# Patient Record
Sex: Female | Born: 1995 | Race: White | Hispanic: No | Marital: Single | Smoking: Former smoker
Health system: Southern US, Community
[De-identification: ages and names within clinical notes are randomized; demographics above are authoritative.]

## PROBLEM LIST (undated history)

## (undated) DIAGNOSIS — Z9152 Personal history of nonsuicidal self-harm: Secondary | ICD-10-CM

## (undated) DIAGNOSIS — F329 Major depressive disorder, single episode, unspecified: Secondary | ICD-10-CM

## (undated) DIAGNOSIS — N920 Excessive and frequent menstruation with regular cycle: Secondary | ICD-10-CM

## (undated) DIAGNOSIS — F32A Depression, unspecified: Secondary | ICD-10-CM

## (undated) DIAGNOSIS — Z8659 Personal history of other mental and behavioral disorders: Secondary | ICD-10-CM

## (undated) HISTORY — DX: Major depressive disorder, single episode, unspecified: F32.9

## (undated) HISTORY — DX: Depression, unspecified: F32.A

## (undated) HISTORY — DX: Personal history of other mental and behavioral disorders: Z86.59

## (undated) HISTORY — DX: Personal history of nonsuicidal self-harm: Z91.52

## (undated) HISTORY — DX: Excessive and frequent menstruation with regular cycle: N92.0

---
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
## 2013-03-11 ENCOUNTER — Encounter: Payer: Self-pay | Admitting: Internal Medicine

## 2013-03-11 ENCOUNTER — Ambulatory Visit (INDEPENDENT_AMBULATORY_CARE_PROVIDER_SITE_OTHER): Payer: Federal, State, Local not specified - PPO | Admitting: Internal Medicine

## 2013-03-11 VITALS — BP 116/70 | HR 81 | Temp 99.0°F | Wt 176.2 lb

## 2013-03-11 DIAGNOSIS — J069 Acute upper respiratory infection, unspecified: Secondary | ICD-10-CM

## 2013-03-11 MED ORDER — AZITHROMYCIN 250 MG PO TABS: ORAL_TABLET | ORAL | Status: DC

## 2013-03-11 NOTE — Progress Notes (Signed)
Pre-visit discussion using our clinic review tool. No additional management support is needed unless otherwise documented below in the visit note.  

## 2013-03-11 NOTE — Progress Notes (Signed)
HPI  Pt presents to the clinic today with c/o cold symptoms x 4 days. She has a productive cough of thick yellow sputum, nasal congestion and ear fullness. She has had fevers, but denies chill and body aches. She has taken Mucinex, Dayquil, Nyquil, Delsym and allergy medication without any relief. She has no history of allergies or asthma. She has had sick contacts. She did not get the flu shot.  Review of Systems      Past Medical History  Diagnosis Date  . History of self mutilation     h/o admissions to behavioral health  . Depression   . Menorrhagia     Family History  Problem Relation Age of Onset  . Heart disease Father 8446    MI  . Cancer Maternal Grandmother 40    breast ca    History   Social History  . Marital Status: Single    Spouse Name: N/A    Number of Children: N/A  . Years of Education: N/A   Occupational History  . Not on file.   Social History Main Topics  . Smoking status: Current Every Day Smoker  . Smokeless tobacco: Not on file  . Alcohol Use: No  . Drug Use: Not on file  . Sexual Activity: Not on file   Other Topics Concern  . Not on file   Social History Narrative   Dropped out of school.   Going to get her GED, wants to go to community college.   Not sexually active.   Good relationship with her mother.    No Known Allergies   Constitutional: Positive headache, fatigue and fever. Denies abrupt weight changes.  HEENT:  Positive sore throat. Denies eye redness, eye pain, pressure behind the eyes, facial pain, nasal congestion, ear pain, ringing in the ears, wax buildup, runny nose or bloody nose. Respiratory: Positive cough. Denies difficulty breathing or shortness of breath.  Cardiovascular: Denies chest pain, chest tightness, palpitations or swelling in the hands or feet.   No other specific complaints in a complete review of systems (except as listed in HPI above).  Objective:   BP 116/70  Pulse 81  Temp(Src) 99 F (37.2 C)  (Oral)  Wt 176 lb 4 oz (79.946 kg)  SpO2 99%  LMP 02/08/2013 Wt Readings from Last 3 Encounters:  03/11/13 176 lb 4 oz (79.946 kg) (94%*, Z = 1.54)  10/17/12 183 lb 8 oz (83.235 kg) (96%*, Z = 1.70)  11/20/11 166 lb (75.297 kg) (92%*, Z = 1.41)   * Growth percentiles are based on CDC 2-20 Years data.     General: Appears her stated age, well developed, well nourished in NAD. HEENT: Head: normal shape and size; Eyes: sclera white, no icterus, conjunctiva pink, PERRLA and EOMs intact; Ears: Tm's gray and intact, normal light reflex, + effusion bilaterally; Nose: mucosa pink and moist, septum midline; Throat/Mouth: + PND. Teeth present, tonsils enlarged,  mucosa erythematous and moist, no exudate noted, no lesions or ulcerations noted.  Neck: Mild cervical lymphadenopathy. Neck supple, trachea midline. No massses, lumps or thyromegaly present.  Cardiovascular: Normal rate and rhythm. S1,S2 noted.  No murmur, rubs or gallops noted. No JVD or BLE edema. No carotid bruits noted. Pulmonary/Chest: Normal effort and positive vesicular breath sounds. No respiratory distress. No wheezes, rales or ronchi noted.      Assessment & Plan:   Upper Respiratory Infection:  Get some rest and drink plenty of water Do salt water gargles for the sore  throat Given fever and duration of symptoms, will give eRx for Azithromax x 5 days Continue tylenol  RTC as needed or if symptoms persist.

## 2013-03-11 NOTE — Patient Instructions (Signed)

## 2013-03-12 ENCOUNTER — Telehealth: Payer: Self-pay | Admitting: Family Medicine

## 2013-03-12 NOTE — Telephone Encounter (Signed)
Relevant patient education assigned to patient using Emmi. ° °

## 2013-08-20 ENCOUNTER — Encounter: Payer: Self-pay | Admitting: Family Medicine

## 2013-08-20 ENCOUNTER — Ambulatory Visit (INDEPENDENT_AMBULATORY_CARE_PROVIDER_SITE_OTHER): Payer: Federal, State, Local not specified - PPO | Admitting: Family Medicine

## 2013-08-20 VITALS — BP 118/72 | HR 80 | Temp 98.4°F | Wt 168.2 lb

## 2013-08-20 DIAGNOSIS — J209 Acute bronchitis, unspecified: Secondary | ICD-10-CM

## 2013-08-20 MED ORDER — AZITHROMYCIN 250 MG PO TABS: ORAL_TABLET | ORAL | Status: DC

## 2013-08-20 MED ORDER — GUAIFENESIN-CODEINE 100-10 MG/5ML PO SYRP: 5.0000 mL | ORAL_SOLUTION | Freq: Every evening | ORAL | Status: DC | PRN

## 2013-08-20 NOTE — Progress Notes (Signed)
Pre visit review using our clinic review tool, if applicable. No additional management support is needed unless otherwise documented below in the visit note. 

## 2013-08-20 NOTE — Assessment & Plan Note (Signed)
Discussed supportive care in form of ibuprofen, rest and fluids. Will treat with cheratussin for night time cough suppression. Provided with zpack script to fill if no improvement in next few days, or sooner if worsening. Pt agrees with plan.

## 2013-08-20 NOTE — Progress Notes (Signed)
   BP 118/72  Pulse 80  Temp(Src) 98.4 F (36.9 C) (Oral)  Wt 168 lb 4 oz (76.318 kg)  SpO2 97%  LMP 07/25/2013   CC: cough  Subjective:    Patient ID: Nancy Rojas, female    DOB: 1996-02-02, 18 y.o.   MRN: [id number]  HPI: Nancy Rojas is a 18 y.o. female presenting on 08/20/2013 for Cough   9 d h/o ST that developed into RN, congestion, muffled hearing with ear pain, cough productive of green sputum, PNdrainage that led to nausea/vomiting.  Coughing fits that lead to gagging.  Cough waking her up at night as well.  No fevers/chills, abd pain,   So far has tried mucinex.  Smoking 2-3 cig/day. No sick contacts at home No asthma hx.  Relevant past medical, surgical, family and social history reviewed and updated as indicated.  Allergies and medications reviewed and updated. Current Outpatient Prescriptions on File Prior to Visit  Medication Sig  . etonogestrel-ethinyl estradiol (NUVARING) 0.12-0.015 MG/24HR vaginal ring Insert vaginally and leave in place for 3 consecutive weeks, then remove for 1 week.   No current facility-administered medications on file prior to visit.    Review of Systems Per HPI unless specifically indicated above    Objective:    BP 118/72  Pulse 80  Temp(Src) 98.4 F (36.9 C) (Oral)  Wt 168 lb 4 oz (76.318 kg)  SpO2 97%  LMP 07/25/2013  Physical Exam  Nursing note and vitals reviewed. Constitutional: She appears well-developed and well-nourished. No distress.  HENT:  Head: Normocephalic and atraumatic.  Right Ear: Hearing, tympanic membrane, external ear and ear canal normal.  Left Ear: Hearing, tympanic membrane, external ear and ear canal normal.  Nose: No mucosal edema or rhinorrhea. Right sinus exhibits no maxillary sinus tenderness and no frontal sinus tenderness. Left sinus exhibits no maxillary sinus tenderness and no frontal sinus tenderness.  Mouth/Throat: Uvula is midline, oropharynx is clear and moist and mucous  membranes are normal. No oropharyngeal exudate, posterior oropharyngeal edema, posterior oropharyngeal erythema or tonsillar abscesses.  Eyes: Conjunctivae and EOM are normal. Pupils are equal, round, and reactive to light. No scleral icterus.  Neck: Normal range of motion. Neck supple.  Cardiovascular: Normal rate, regular rhythm, normal heart sounds and intact distal pulses.   No murmur heard. Pulmonary/Chest: Effort normal and breath sounds normal. No respiratory distress. She has no wheezes. She has no rales.  Deep harsh cough present  Lymphadenopathy:    She has no cervical adenopathy.  Skin: Skin is warm and dry. No rash noted.       Assessment & Plan:   Problem List Items Addressed This Visit   Acute bronchitis - Primary     Discussed supportive care in form of ibuprofen, rest and fluids. Will treat with cheratussin for night time cough suppression. Provided with zpack script to fill if no improvement in next few days, or sooner if worsening. Pt agrees with plan.        Follow up plan: Return if symptoms worsen or fail to improve.

## 2013-08-20 NOTE — Patient Instructions (Signed)
I think you have developed bronchitis Treat with lots of rest and lots of fluids. May use ibuprofen 400mg  twice daily with food for inflammation in lungs. May try codeine cough syrup for night time (may make you sleepy). If not improving over next 1-2 days or any worsening, fill antibiotic (zpack) prescribed today. Watch for fevers, worsening productive cough, or just not improving as expected - and let us know if that happens.

## 2013-08-28 ENCOUNTER — Ambulatory Visit: Payer: Federal, State, Local not specified - PPO | Admitting: Internal Medicine

## 2013-08-28 DIAGNOSIS — Z0289 Encounter for other administrative examinations: Secondary | ICD-10-CM

## 2013-08-31 ENCOUNTER — Ambulatory Visit: Payer: Federal, State, Local not specified - PPO | Admitting: Internal Medicine

## 2013-08-31 DIAGNOSIS — Z0289 Encounter for other administrative examinations: Secondary | ICD-10-CM

## 2013-09-10 ENCOUNTER — Ambulatory Visit: Payer: Federal, State, Local not specified - PPO | Admitting: Family Medicine

## 2013-11-04 ENCOUNTER — Encounter: Payer: Self-pay | Admitting: Family Medicine

## 2013-11-04 ENCOUNTER — Ambulatory Visit (INDEPENDENT_AMBULATORY_CARE_PROVIDER_SITE_OTHER): Payer: Federal, State, Local not specified - PPO | Admitting: Family Medicine

## 2013-11-04 VITALS — BP 116/66 | HR 77 | Temp 98.3°F | Ht 67.25 in | Wt 164.0 lb

## 2013-11-04 DIAGNOSIS — N921 Excessive and frequent menstruation with irregular cycle: Secondary | ICD-10-CM

## 2013-11-04 DIAGNOSIS — N920 Excessive and frequent menstruation with regular cycle: Secondary | ICD-10-CM

## 2013-11-04 DIAGNOSIS — Z3009 Encounter for other general counseling and advice on contraception: Secondary | ICD-10-CM

## 2013-11-04 DIAGNOSIS — Z309 Encounter for contraceptive management, unspecified: Secondary | ICD-10-CM | POA: Insufficient documentation

## 2013-11-04 DIAGNOSIS — F988 Other specified behavioral and emotional disorders with onset usually occurring in childhood and adolescence: Secondary | ICD-10-CM | POA: Insufficient documentation

## 2013-11-04 MED ORDER — ETONOGESTREL-ETHINYL ESTRADIOL 0.12-0.015 MG/24HR VA RING: VAGINAL_RING | VAGINAL | Status: DC

## 2013-11-04 NOTE — Assessment & Plan Note (Signed)
Improved. >25 minutes spent in face to face time with patient, >50% spent in counselling or coordination of care Nuvaring refilled.

## 2013-11-04 NOTE — Assessment & Plan Note (Signed)
Explained to pt that she would need formal psych eval. Referral placed.

## 2013-11-04 NOTE — Progress Notes (Signed)
   Subjective:   Patient ID: Nancy Rojas, female    DOB: Mar 24, 1995, 18 y.o.   MRN: [id number]  [name] is a pleasant 18 y.o. year old female who presents to clinic today with Follow-up  on 11/04/2013  HPI: Menorrhagia- no longer sexually active.  Likes nuvaring- periods are now regular and light.  Minimal cramping.  ?ADD- per pt, was diagnosed with ADD years ago. Has h/o depression and self mutilation. Wants me to prescribe stimulant for her.  No current outpatient prescriptions on file prior to visit.   No current facility-administered medications on file prior to visit.    No Known Allergies  Past Medical History  Diagnosis Date  . History of self mutilation     h/o admissions to behavioral health  . Depression   . Menorrhagia     No past surgical history on file.  Family History  Problem Relation Age of Onset  . Heart disease Father 49    MI  . Cancer Maternal Grandmother 40    breast ca    History   Social History  . Marital Status: Single    Spouse Name: N/A    Number of Children: N/A  . Years of Education: N/A   Occupational History  . Not on file.   Social History Main Topics  . Smoking status: Current Every Day Smoker  . Smokeless tobacco: Not on file  . Alcohol Use: No  . Drug Use: Not on file  . Sexual Activity: Not on file   Other Topics Concern  . Not on file   Social History Narrative   Dropped out of school.   Going to get her GED, wants to go to community college.   Not sexually active.   Good relationship with her mother.   The PMH, PSH, Social History, Family History, Medications, and allergies have been reviewed in [facility], and have been updated if relevant.   Review of Systems See HPI Denies any current anxiety or depression +difficulty concentrating at work No SI or HI    Objective:    BP 116/66  Pulse 77  Temp(Src) 98.3 F (36.8 C) (Oral)  Ht 5' 7.25" (1.708 m)  Wt 164 lb (74.39 kg)  BMI 25.50 kg/m2   SpO2 99%  LMP 10/29/2013   Physical Exam  Nursing note and vitals reviewed. Constitutional: She appears well-developed and well-nourished. No distress.  Skin: Skin is warm and dry.  Psychiatric: She has a normal mood and affect. Her behavior is normal. Judgment and thought content normal.          Assessment & Plan:   Encounter for other general counseling or advice on contraception  ADD (attention deficit disorder) - Plan: Ambulatory referral to Psychology  Menorrhagia with irregular cycle No Follow-up on file.

## 2013-11-04 NOTE — Progress Notes (Signed)
Pre visit review using our clinic review tool, if applicable. No additional management support is needed unless otherwise documented below in the visit note. 

## 2013-11-27 ENCOUNTER — Ambulatory Visit (INDEPENDENT_AMBULATORY_CARE_PROVIDER_SITE_OTHER): Payer: Federal, State, Local not specified - PPO | Admitting: Psychology

## 2013-11-27 DIAGNOSIS — F331 Major depressive disorder, recurrent, moderate: Secondary | ICD-10-CM

## 2013-11-27 DIAGNOSIS — F988 Other specified behavioral and emotional disorders with onset usually occurring in childhood and adolescence: Secondary | ICD-10-CM

## 2013-11-30 ENCOUNTER — Ambulatory Visit: Payer: Federal, State, Local not specified - PPO | Admitting: Family Medicine

## 2013-11-30 DIAGNOSIS — Z0289 Encounter for other administrative examinations: Secondary | ICD-10-CM

## 2013-12-09 ENCOUNTER — Ambulatory Visit (INDEPENDENT_AMBULATORY_CARE_PROVIDER_SITE_OTHER): Payer: Federal, State, Local not specified - PPO | Admitting: Psychology

## 2013-12-09 DIAGNOSIS — F900 Attention-deficit hyperactivity disorder, predominantly inattentive type: Secondary | ICD-10-CM

## 2013-12-09 DIAGNOSIS — F411 Generalized anxiety disorder: Secondary | ICD-10-CM

## 2013-12-18 ENCOUNTER — Ambulatory Visit: Payer: Federal, State, Local not specified - PPO | Admitting: Psychology

## 2014-07-16 ENCOUNTER — Ambulatory Visit (INDEPENDENT_AMBULATORY_CARE_PROVIDER_SITE_OTHER): Payer: Federal, State, Local not specified - PPO | Admitting: Emergency Medicine

## 2014-07-16 VITALS — BP 122/68 | HR 82 | Temp 98.0°F | Resp 17 | Ht 68.0 in | Wt 173.0 lb

## 2014-07-16 DIAGNOSIS — J020 Streptococcal pharyngitis: Secondary | ICD-10-CM

## 2014-07-16 MED ORDER — PENICILLIN V POTASSIUM 500 MG PO TABS: 500.0000 mg | ORAL_TABLET | Freq: Four times a day (QID) | ORAL | Status: DC

## 2014-07-16 NOTE — Patient Instructions (Signed)

## 2014-07-16 NOTE — Progress Notes (Signed)
[facility] [address], [place] [id number] [phone]  Date:  07/16/2014   Name:  Nancy MountValerie R Copado   DOB:  05-08-1995   MRN:  [id number]  PCP:  Ruthe Mannanalia Aron, MD    Chief Complaint: Sore Throat   History of Present Illness:  Nancy Rojas is a 19 y.o. very pleasant female patient who presents with the following:  Ill three days with increasing sore throat No coryza or post nasal drip No cough, wheezing or shortness of breath Some malaise and fatigue No nausea or vomiting No rash No ill ocntacts No improvement with over the counter medications or other home remedies. . Denies other complaint or health concern today.   Patient Active Problem List   Diagnosis Date Noted  . Contraception management 11/04/2013  . ADD (attention deficit disorder) 11/04/2013  . Acute bronchitis 08/20/2013  . Routine general medical examination at a health care facility 10/17/2010  . History of self mutilation   . Depression   . Menorrhagia     Past Medical History  Diagnosis Date  . History of self mutilation     h/o admissions to behavioral health  . Depression   . Menorrhagia     No past surgical history on file.  History  Substance Use Topics  . Smoking status: Former Smoker    Quit date: 06/17/2014  . Smokeless tobacco: Not on file  . Alcohol Use: No    Family History  Problem Relation Age of Onset  . Heart disease Father 7746    MI  . Cancer Maternal Grandmother 40    breast ca    No Known Allergies  Medication list has been reviewed and updated.  Current Outpatient Prescriptions on File Prior to Visit  Medication Sig Dispense Refill  . etonogestrel-ethinyl estradiol (NUVARING) 0.12-0.015 MG/24HR vaginal ring Insert vaginally and leave in place for 3 consecutive weeks, then remove for 1 week. 1 each 11   No current facility-administered medications on file prior to visit.    Review of Systems:  Review of Systems  Constitutional:  Negative for fever, chills and fatigue.  HENT: Negative for congestion, ear pain, hearing loss, postnasal drip, rhinorrhea and sinus pressure.   Eyes: Negative for discharge and redness.  Respiratory: Negative for cough, shortness of breath and wheezing.   Cardiovascular: Negative for chest pain and leg swelling.  Gastrointestinal: Negative for nausea, vomiting, abdominal pain, constipation and blood in stool.  Genitourinary: Negative for dysuria, urgency and frequency.  Musculoskeletal: Negative for neck stiffness.  Skin: Negative for rash.  Neurological: Negative for seizures, weakness and headaches.   Physical Examination: Filed Vitals:   07/16/14 0918  BP: 122/68  Pulse: 82  Temp: 98 F (36.7 C)  Resp: 17   Filed Vitals:   07/16/14 0918  Height: 5\' 8"  (1.727 m)  Weight: 173 lb (78.472 kg)   Body mass index is 26.31 kg/(m^2). Ideal Body Weight: Weight in (lb) to have BMI = 25: 164.1  GEN: WDWN, NAD, Non-toxic, A & O x 3 HEENT: Atraumatic, Normocephalic. Neck supple. No masses, anterior cervical LAD.  Exudative pharyngitis Ears and Nose: No external deformity. CV: RRR, No M/G/R. No JVD. No thrill. No extra heart sounds. PULM: CTA B, no wheezes, crackles, rhonchi. No retractions. No resp. distress. No accessory muscle use. ABD: S, NT, ND, +BS. No rebound. No HSM. EXTR: No c/c/e NEURO Normal gait.  PSYCH: Normally interactive. Conversant. Not depressed or anxious appearing.  Calm demeanor.  Assessment and Plan: 1. Streptococcal sore throat Pen vk    Signed Phillips OdorJeffery Sawyer Kahan, MD

## 2014-10-11 ENCOUNTER — Ambulatory Visit (INDEPENDENT_AMBULATORY_CARE_PROVIDER_SITE_OTHER): Payer: Federal, State, Local not specified - PPO | Admitting: Family Medicine

## 2014-10-11 VITALS — BP 110/74 | HR 77 | Temp 98.4°F | Resp 18 | Ht 68.0 in | Wt 179.2 lb

## 2014-10-11 DIAGNOSIS — B001 Herpesviral vesicular dermatitis: Secondary | ICD-10-CM

## 2014-10-11 MED ORDER — VALACYCLOVIR HCL 1 G PO TABS: ORAL_TABLET | ORAL | Status: DC

## 2014-10-11 NOTE — Progress Notes (Signed)
[facility] [address] [place] [address] [phone]  Date:  10/11/2014   Name:  Nancy Rojas   DOB:  1996-01-06   MRN:  [id number]  PCP:  Ruthe Mannan, MD    Chief Complaint: Mouth Lesions   History of Present Illness:  Nancy Rojas is a 19 y.o. very pleasant female patient who presents with the following:  She has a histroy of cold sores- recent strep throat as well.  Her throat is now better.   She tried valtrex in the past but does not have any now.   She may get a cold sore every couple of months .    Patient Active Problem List   Diagnosis Date Noted  . Contraception management 11/04/2013  . ADD (attention deficit disorder) 11/04/2013  . Acute bronchitis 08/20/2013  . Routine general medical examination at a health care facility 10/17/2010  . History of self mutilation   . Depression   . Menorrhagia     Past Medical History  Diagnosis Date  . History of self mutilation     h/o admissions to behavioral health  . Depression   . Menorrhagia     History reviewed. No pertinent past surgical history.  History  Substance Use Topics  . Smoking status: Former Smoker    Quit date: 06/17/2014  . Smokeless tobacco: Not on file  . Alcohol Use: No    Family History  Problem Relation Age of Onset  . Heart disease Father 60    MI  . Cancer Maternal Grandmother 40    breast ca    No Known Allergies  Medication list has been reviewed and updated.  Current Outpatient Prescriptions on File Prior to Visit  Medication Sig Dispense Refill  . etonogestrel-ethinyl estradiol (NUVARING) 0.12-0.015 MG/24HR vaginal ring Insert vaginally and leave in place for 3 consecutive weeks, then remove for 1 week. (Patient not taking: Reported on 10/11/2014) 1 each 11  . penicillin v potassium (VEETID) 500 MG tablet Take 1 tablet (500 mg total) by mouth 4 (four) times daily. (Patient not taking: Reported on 10/11/2014) 40 tablet 0   No current  facility-administered medications on file prior to visit.    Review of Systems:  As per HPI- otherwise negative.   Physical Examination: Filed Vitals:   10/11/14 1317  BP: 110/74  Pulse: 77  Temp: 98.4 F (36.9 C)  Resp: 18   Filed Vitals:   10/11/14 1317  Height:  (1.727 m)  Weight: 179 lb 3.2 oz (81.285 kg)   Body mass index is 27.25 kg/(m^2). Ideal Body Weight: Weight in (lb) to have BMI = 25: 164.1  GEN: WDWN, NAD, Non-toxic, A & O x 3, looks well HEENT: Atraumatic, Normocephalic. Neck supple. No masses, No LAD.  Bilateral TM wnl, oropharynx normal.  PEERL,EOMI.   One healing and one newer cold sore on bottom lip Ears and Nose: No external deformity. CV: RRR, No M/G/R. No JVD. No thrill. No extra heart sounds. PULM: CTA B, no wheezes, crackles, rhonchi. No retractions. No resp. distress. No accessory muscle use. EXTR: No c/c/e NEURO Normal gait.  PSYCH: Normally interactive. Conversant. Not depressed or anxious appearing.  Calm demeanor.    Assessment and Plan: Cold sore - Plan: valACYclovir (VALTREX) 1000 MG tablet  Treat with valtrex as needed for cold sores   Signed Abbe Amsterdam, MD

## 2015-01-10 ENCOUNTER — Other Ambulatory Visit: Payer: Self-pay

## 2015-01-10 DIAGNOSIS — B001 Herpesviral vesicular dermatitis: Secondary | ICD-10-CM

## 2015-01-10 NOTE — Telephone Encounter (Signed)
pts mom DPR signed request refill valtrex to [facility]. rx prescribed by Dr Shanda BumpsJessica Copland on 10/11/14; pt last saw Dr Dayton MartesAron on 11/04/13. pts mom wants to know if Dr Dayton MartesAron will refill med? pts mom request cb on 01/11/15. No future appt scheduled.

## 2015-01-11 MED ORDER — VALACYCLOVIR HCL 1 G PO TABS: ORAL_TABLET | ORAL | Status: DC

## 2015-05-15 ENCOUNTER — Emergency Department (HOSPITAL_COMMUNITY)
Admission: EM | Admit: 2015-05-15 | Discharge: 2015-05-15 | Disposition: A | Discharge status: Home / Self Care | Payer: Federal, State, Local not specified - PPO | Attending: Emergency Medicine | Admitting: Emergency Medicine

## 2015-05-15 ENCOUNTER — Encounter (HOSPITAL_COMMUNITY): Payer: Self-pay | Admitting: Emergency Medicine

## 2015-05-15 DIAGNOSIS — Z8742 Personal history of other diseases of the female genital tract: Secondary | ICD-10-CM | POA: Diagnosis not present

## 2015-05-15 DIAGNOSIS — Z8659 Personal history of other mental and behavioral disorders: Secondary | ICD-10-CM | POA: Diagnosis not present

## 2015-05-15 DIAGNOSIS — J029 Acute pharyngitis, unspecified: Secondary | ICD-10-CM | POA: Diagnosis present

## 2015-05-15 DIAGNOSIS — B349 Viral infection, unspecified: Secondary | ICD-10-CM | POA: Insufficient documentation

## 2015-05-15 DIAGNOSIS — R109 Unspecified abdominal pain: Secondary | ICD-10-CM | POA: Insufficient documentation

## 2015-05-15 DIAGNOSIS — F17210 Nicotine dependence, cigarettes, uncomplicated: Secondary | ICD-10-CM | POA: Insufficient documentation

## 2015-05-15 LAB — RAPID STREP SCREEN (MED CTR MEBANE ONLY): STREPTOCOCCUS, GROUP A SCREEN (DIRECT): NEGATIVE

## 2015-05-15 MED ORDER — ONDANSETRON 4 MG PO TBDP: 4.0000 mg | ORAL_TABLET | Freq: Three times a day (TID) | ORAL | Status: DC | PRN

## 2015-05-15 MED ORDER — ONDANSETRON 4 MG PO TBDP
4.0000 mg | ORAL_TABLET | Freq: Once | ORAL | Status: AC
  Administered 2015-05-15: 4 mg via ORAL
  Filled 2015-05-15: qty 1

## 2015-05-15 MED ORDER — IBUPROFEN 800 MG PO TABS
800.0000 mg | ORAL_TABLET | Freq: Once | ORAL | Status: AC
  Administered 2015-05-15: 800 mg via ORAL
  Filled 2015-05-15: qty 1

## 2015-05-15 NOTE — ED Provider Notes (Signed)
CSN: [id number]     Arrival date & time 05/15/15  2114 History  By signing my name below, I, Doreatha Martinva Mathews, attest that this documentation has been prepared under the direction and in the presence of Krishon Adkison K. Vale Peraza, PA-C. Electronically Signed: Doreatha MartinEva Mathews, ED Scribe. 05/15/2015. 10:21 PM.    Chief Complaint  Patient presents with  . Sore Throat   The history is provided by the patient. No language interpreter was used.   HPI Comments: Nancy Rojas is a 20 y.o. female who presents to the Emergency Department complaining of moderate sore throat onset 4 days ago with associated generalized myalgias, diarrhea, emesis, fever, abdominal pain, cough, nausea. Pt states that her sore throat is worsened with swallowing, eating and drinking. She reports that she is able to tolerate fluids. No known sick contacts with similar symptoms. Pt has not had a flu shot this year. No daily medications. NKDA. She denies dysuria, difficulty tolerating secretions.   Past Medical History  Diagnosis Date  . History of self mutilation     h/o admissions to behavioral health  . Depression   . Menorrhagia    History reviewed. No pertinent past surgical history. Family History  Problem Relation Age of Onset  . Heart disease Father 5746    MI  . Cancer Maternal Grandmother 40    breast ca   Social History  Substance Use Topics  . Smoking status: Current Every Day Smoker -- 0.25 packs/day    Types: Cigarettes  . Smokeless tobacco: None  . Alcohol Use: No   OB History    No data available     Review of Systems  Constitutional: Positive for fever.  HENT: Positive for sore throat.   Respiratory: Positive for cough.   Gastrointestinal: Positive for nausea, vomiting, abdominal pain and diarrhea.  Genitourinary: Negative for dysuria.  Musculoskeletal: Positive for myalgias.  All other systems reviewed and are negative.  Allergies  Review of patient's allergies indicates no known allergies.  Home  Medications   Prior to Admission medications   Medication Sig Start Date End Date Taking? Authorizing Provider  etonogestrel-ethinyl estradiol (NUVARING) 0.12-0.015 MG/24HR vaginal ring Insert vaginally and leave in place for 3 consecutive weeks, then remove for 1 week. Patient not taking: Reported on 10/11/2014 11/04/13   Dianne Dunalia M Aron, MD  valACYclovir (VALTREX) 1000 MG tablet Take two pills, then take another two 12 hours later.  Use as needed for cold sore 01/11/15   Dianne Dunalia M Aron, MD   BP 124/80 mmHg  Pulse 96  Temp(Src) 99.5 F (37.5 C) (Oral)  Resp 18  Ht 5\' 8"  (1.727 m)  Wt 185 lb (83.915 kg)  BMI 28.14 kg/m2  SpO2 100%  LMP 05/10/2015 (Exact Date) Physical Exam  Constitutional: She is oriented to person, place, and time. She appears well-developed and well-nourished.  HENT:  Head: Normocephalic and atraumatic.  Right Ear: Tympanic membrane normal.  Left Ear: Tympanic membrane normal.  Nose: Nose normal.  Mouth/Throat: Uvula is midline and mucous membranes are normal. Posterior oropharyngeal erythema present. No oropharyngeal exudate, posterior oropharyngeal edema or tonsillar abscesses.  Mild posterior oropharyngeal erythema. No posterior oropharyngeal edema or exudate. No unilateral swelling. Uvula midline. No tonsillar abscess. Moist mucous membranes.    Eyes: Conjunctivae and EOM are normal. Pupils are equal, round, and reactive to light.  Neck: Normal range of motion. Neck supple.  Cardiovascular: Normal rate, regular rhythm and normal heart sounds.  Exam reveals no gallop and no friction rub.  No murmur heard. Pulmonary/Chest: Effort normal and breath sounds normal. No respiratory distress. She has no wheezes. She has no rales.  Abdominal: Soft. Bowel sounds are normal. She exhibits no distension. There is no tenderness.  Musculoskeletal: Normal range of motion.  Neurological: She is alert and oriented to person, place, and time.  Skin: Skin is warm and dry.  Psychiatric:  She has a normal mood and affect. Her behavior is normal.  Nursing note and vitals reviewed.   ED Course  Procedures (including critical care time) DIAGNOSTIC STUDIES: Oxygen Saturation is 100% on RA, normal by my interpretation.    COORDINATION OF CARE: 10:19 PM Discussed treatment plan with pt at bedside which includes rapid strep and pt agreed to plan.   Labs Review Labs Reviewed  RAPID STREP SCREEN (NOT AT [facility])  CULTURE, GROUP A STREP [facility])   I have personally reviewed and evaluated these lab results as part of my medical decision-making.   MDM   Final diagnoses:  Viral illness    Patient with symptoms consistent with influenza.  Rapid strep negative. Vitals are stable, low-grade fever.  No signs of dehydration, tolerating PO's.  Lungs are clear. Due to patient's presentation and physical exam a chest x-ray was not ordered bc likely diagnosis of flu.  Discussed the cost versus benefit of Tamiflu treatment with the patient.  The patient understands that symptoms are greater than the recommended 24-48 hour window of treatment.  Patient will be discharged with instructions to orally hydrate, rest, and use over-the-counter medications such as anti-inflammatories ibuprofen and Aleve for muscle aches and Tylenol for fever.  Patient will also be given a cough suppressant.    Meds ordered this encounter  Medications  . ondansetron (ZOFRAN-ODT) disintegrating tablet 4 mg    Sig:   . ibuprofen (ADVIL,MOTRIN) tablet 800 mg    Sig:   . ondansetron (ZOFRAN ODT) 4 MG disintegrating tablet    Sig: Take 1 tablet (4 mg total) by mouth every 8 (eight) hours as needed for nausea or vomiting.    Dispense:  20 tablet    Refill:  0    Order Specific Question:  Supervising Provider    Answer:  Jerelyn Scott [[id number]]     Medication List    TAKE these medications        ondansetron 4 MG disintegrating tablet  Commonly known as:  ZOFRAN ODT  Take 1 tablet (4 mg total) by mouth every 8  (eight) hours as needed for nausea or vomiting.      ASK your doctor about these medications        etonogestrel-ethinyl estradiol 0.12-0.015 MG/24HR vaginal ring  Commonly known as:  NUVARING  Insert vaginally and leave in place for 3 consecutive weeks, then remove for 1 week.     valACYclovir 1000 MG tablet  Commonly known as:  VALTREX  Take two pills, then take another two 12 hours later.  Use as needed for cold sore      An After Visit Summary was printed and given to the patient. I personally performed the services in this documentation, which was scribed in my presence.  The recorded information has been reviewed and considered.   Barnet Pall.  Lonia Skinner Edinburgh, PA-C 05/15/15 2324  Arby Barrette, MD 05/18/15 1535

## 2015-05-15 NOTE — Discharge Instructions (Signed)
Nausea and Vomiting °Nausea is a sick feeling that often comes before throwing up (vomiting). Vomiting is a reflex where stomach contents come out of your mouth. Vomiting can cause severe loss of body fluids (dehydration). Children and elderly adults can become dehydrated quickly, especially if they also have diarrhea. Nausea and vomiting are symptoms of a condition or disease. It is important to find the cause of your symptoms. °CAUSES  °· Direct irritation of the stomach lining. This irritation can result from increased acid production (gastroesophageal reflux disease), infection, food poisoning, taking certain medicines (such as nonsteroidal anti-inflammatory drugs), alcohol use, or tobacco use. °· Signals from the brain. These signals could be caused by a headache, heat exposure, an inner ear disturbance, increased pressure in the brain from injury, infection, a tumor, or a concussion, pain, emotional stimulus, or metabolic problems. °· An obstruction in the gastrointestinal tract (bowel obstruction). °· Illnesses such as diabetes, hepatitis, gallbladder problems, appendicitis, kidney problems, cancer, sepsis, atypical symptoms of a heart attack, or eating disorders. °· Medical treatments such as chemotherapy and radiation. °· Receiving medicine that makes you sleep (general anesthetic) during surgery. °DIAGNOSIS °Your caregiver may ask for tests to be done if the problems do not improve after a few days. Tests may also be done if symptoms are severe or if the reason for the nausea and vomiting is not clear. Tests may include: °· Urine tests. °· Blood tests. °· Stool tests. °· Cultures (to look for evidence of infection). °· X-rays or other imaging studies. °Test results can help your caregiver make decisions about treatment or the need for additional tests. °TREATMENT °You need to stay well hydrated. Drink frequently but in small amounts. You may wish to drink water, sports drinks, clear broth, or eat frozen  ice pops or gelatin dessert to help stay hydrated. When you eat, eating slowly may help prevent nausea. There are also some antinausea medicines that may help prevent nausea. °HOME CARE INSTRUCTIONS  °· Take all medicine as directed by your caregiver. °· If you do not have an appetite, do not force yourself to eat. However, you must continue to drink fluids. °· If you have an appetite, eat a normal diet unless your caregiver tells you differently. °· Eat a variety of complex carbohydrates (rice, wheat, potatoes, bread), lean meats, yogurt, fruits, and vegetables. °· Avoid high-fat foods because they are more difficult to digest. °· Drink enough water and fluids to keep your urine clear or pale yellow. °· If you are dehydrated, ask your caregiver for specific rehydration instructions. Signs of dehydration may include: °· Severe thirst. °· Dry lips and mouth. °· Dizziness. °· Dark urine. °· Decreasing urine frequency and amount. °· Confusion. °· Rapid breathing or pulse. °SEEK IMMEDIATE MEDICAL CARE IF:  °· You have blood or brown flecks (like coffee grounds) in your vomit. °· You have black or bloody stools. °· You have a severe headache or stiff neck. °· You are confused. °· You have severe abdominal pain. °· You have chest pain or trouble breathing. °· You do not urinate at least once every 8 hours. °· You develop cold or clammy skin. °· You continue to vomit for longer than 24 to 48 hours. °· You have a fever. °MAKE SURE YOU:  °· Understand these instructions. °· Will watch your condition. °· Will get help right away if you are not doing well or get worse. °  °This information is not intended to replace advice given to you by your health care provider. Make sure   you discuss any questions you have with your health care provider. °  °Document Released: 02/19/2005 Document Revised: 05/14/2011 Document Reviewed: 07/19/2010 °Elsevier Interactive Patient Education ©2016 [employer] °Viral Infections °A virus is a type  of germ. Viruses can cause: °· Minor sore throats. °· Aches and pains. °· Headaches. °· Runny nose. °· Rashes. °· Watery eyes. °· Tiredness. °· Coughs. °· Loss of appetite. °· Feeling sick to your stomach (nausea). °· Throwing up (vomiting). °· Watery poop (diarrhea). °HOME CARE  °· Only take medicines as told by your doctor. °· Drink enough water and fluids to keep your pee (urine) clear or pale yellow. Sports drinks are a good choice. °· Get plenty of rest and eat healthy. Soups and broths with crackers or rice are fine. °GET HELP RIGHT AWAY IF:  °· You have a very bad headache. °· You have shortness of breath. °· You have chest pain or neck pain. °· You have an unusual rash. °· You cannot stop throwing up. °· You have watery poop that does not stop. °· You cannot keep fluids down. °· You or your child has a temperature by mouth above 102° F (38.9° C), not controlled by medicine. °· Your baby is older than 3 months with a rectal temperature of 102° F (38.9° C) or higher. °· Your baby is 3 months old or younger with a rectal temperature of 100.4° F (38° C) or higher. °MAKE SURE YOU:  °· Understand these instructions. °· Will watch this condition. °· Will get help right away if you are not doing well or get worse. °  °This information is not intended to replace advice given to you by your health care provider. Make sure you discuss any questions you have with your health care provider. °  °Document Released: 02/02/2008 Document Revised: 05/14/2011 Document Reviewed: 07/28/2014 °Elsevier Interactive Patient Education ©2016 Elsevier Inc. ° °

## 2015-05-15 NOTE — ED Notes (Signed)
Patient c/o sore throat and night sweats x 3-4 days, unsure if she was running a fever. Patient mother reports patient has blisters on her tonsils. Patient using nyquil and ibuprofen at home for symptoms management, does not seem to help. Patient also reports decreased PO intake, due to painful swallowing.

## 2015-05-18 LAB — CULTURE, GROUP A STREP (THRC)

## 2016-09-19 ENCOUNTER — Encounter: Payer: Self-pay | Admitting: Family Medicine

## 2016-09-19 DIAGNOSIS — Z0289 Encounter for other administrative examinations: Secondary | ICD-10-CM

## 2016-09-20 ENCOUNTER — Encounter: Payer: Federal, State, Local not specified - PPO | Admitting: Family Medicine

## 2016-09-20 ENCOUNTER — Other Ambulatory Visit: Payer: Self-pay

## 2016-09-20 DIAGNOSIS — Z0289 Encounter for other administrative examinations: Secondary | ICD-10-CM

## 2016-09-20 MED ORDER — ETONOGESTREL-ETHINYL ESTRADIOL 0.12-0.015 MG/24HR VA RING: VAGINAL_RING | VAGINAL | 11 refills | Status: DC

## 2017-08-14 ENCOUNTER — Other Ambulatory Visit: Payer: Self-pay | Admitting: Family Medicine

## 2017-09-13 ENCOUNTER — Other Ambulatory Visit: Payer: Self-pay | Admitting: Family Medicine

## 2017-09-13 NOTE — Telephone Encounter (Signed)
Pt must be seen first/last OV was 2017/looks like she has had an annual exam with PAP at another facility/thx dmf

## 2017-09-24 ENCOUNTER — Other Ambulatory Visit (HOSPITAL_COMMUNITY)
Admission: RE | Admit: 2017-09-24 | Discharge: 2017-09-24 | Disposition: A | Discharge status: Home / Self Care | Payer: Federal, State, Local not specified - PPO | Source: Ambulatory Visit | Attending: Family Medicine | Admitting: Family Medicine

## 2017-09-24 ENCOUNTER — Encounter: Payer: Self-pay | Admitting: Family Medicine

## 2017-09-24 ENCOUNTER — Ambulatory Visit (INDEPENDENT_AMBULATORY_CARE_PROVIDER_SITE_OTHER): Payer: Federal, State, Local not specified - PPO | Admitting: Family Medicine

## 2017-09-24 VITALS — BP 108/66 | HR 101 | Temp 98.6°F | Ht 67.5 in | Wt 170.0 lb

## 2017-09-24 DIAGNOSIS — F988 Other specified behavioral and emotional disorders with onset usually occurring in childhood and adolescence: Secondary | ICD-10-CM

## 2017-09-24 DIAGNOSIS — N898 Other specified noninflammatory disorders of vagina: Secondary | ICD-10-CM | POA: Insufficient documentation

## 2017-09-24 DIAGNOSIS — Z01419 Encounter for gynecological examination (general) (routine) without abnormal findings: Secondary | ICD-10-CM | POA: Insufficient documentation

## 2017-09-24 DIAGNOSIS — Z3009 Encounter for other general counseling and advice on contraception: Secondary | ICD-10-CM | POA: Diagnosis not present

## 2017-09-24 DIAGNOSIS — Z0000 Encounter for general adult medical examination without abnormal findings: Secondary | ICD-10-CM

## 2017-09-24 DIAGNOSIS — Z113 Encounter for screening for infections with a predominantly sexual mode of transmission: Secondary | ICD-10-CM | POA: Diagnosis not present

## 2017-09-24 DIAGNOSIS — N921 Excessive and frequent menstruation with irregular cycle: Secondary | ICD-10-CM | POA: Diagnosis not present

## 2017-09-24 LAB — CBC WITH DIFFERENTIAL/PLATELET
BASOS ABS: 0.1 10*3/uL (ref 0.0–0.1)
Basophils Relative: 1.7 % (ref 0.0–3.0)
Eosinophils Absolute: 0.1 10*3/uL (ref 0.0–0.7)
Eosinophils Relative: 1.1 % (ref 0.0–5.0)
HEMATOCRIT: 42.2 % (ref 36.0–46.0)
Hemoglobin: 14.3 g/dL (ref 12.0–15.0)
LYMPHS PCT: 41.9 % (ref 12.0–46.0)
Lymphs Abs: 2.5 10*3/uL (ref 0.7–4.0)
MCHC: 33.8 g/dL (ref 30.0–36.0)
MCV: 91.7 fl (ref 78.0–100.0)
MONOS PCT: 6.4 % (ref 3.0–12.0)
Monocytes Absolute: 0.4 10*3/uL (ref 0.1–1.0)
NEUTROS ABS: 2.9 10*3/uL (ref 1.4–7.7)
Neutrophils Relative %: 48.9 % (ref 43.0–77.0)
Platelets: 226 10*3/uL (ref 150.0–400.0)
RBC: 4.61 Mil/uL (ref 3.87–5.11)
RDW: 13.2 % (ref 11.5–15.5)
WBC: 6 10*3/uL (ref 4.0–10.5)

## 2017-09-24 LAB — COMPREHENSIVE METABOLIC PANEL
ALT: 11 U/L (ref 0–35)
AST: 4 U/L (ref 0–37)
Albumin: 4.1 g/dL (ref 3.5–5.2)
Alkaline Phosphatase: 56 U/L (ref 39–117)
BILIRUBIN TOTAL: 0.3 mg/dL (ref 0.2–1.2)
BUN: 10 mg/dL (ref 6–23)
CALCIUM: 8.7 mg/dL (ref 8.4–10.5)
CO2: 25 meq/L (ref 19–32)
Chloride: 105 mEq/L (ref 96–112)
Creatinine, Ser: 0.84 mg/dL (ref 0.40–1.20)
GFR: 88.66 mL/min (ref >60.00)
Glucose, Bld: 90 mg/dL (ref 70–99)
Potassium: 3.9 mEq/L (ref 3.5–5.1)
Sodium: 138 mEq/L (ref 135–145)
Total Protein: 6.7 g/dL (ref 6.0–8.3)

## 2017-09-24 LAB — TSH: TSH: 2.14 u[IU]/mL (ref 0.35–4.50)

## 2017-09-24 MED ORDER — ETONOGESTREL-ETHINYL ESTRADIOL 0.12-0.015 MG/24HR VA RING: VAGINAL_RING | VAGINAL | 3 refills | Status: DC

## 2017-09-24 NOTE — Assessment & Plan Note (Signed)
Likely physiological but will send wet prep and STD probe with pap smear. The patient indicates understanding of these issues and agrees with the plan.

## 2017-09-24 NOTE — Progress Notes (Signed)
Subjective:   Patient ID: Nancy Rojas, female    DOB: March 08, 1995, 22 y.o.   MRN: [id number]  Nancy Rojas is a pleasant 22 y.o. year old female who presents to clinic today with Annual Exam (Patient is here today for a CPE with PAP. She would like STD screening.  She is having some vaginal discharge.  She is currently fasting.  She had her Tdap on 1.18.19 at [facility] due to a dog bite that got infected on face.)  on 09/24/2017  HPI:  I have not seen patient in 5 years.  She is using Nuvaring for contraception. She is sexually active.  Having intermittent vaginal discharge Still having some menorrhagia although it is better with nuvaring.  ADD- followed by psychiatry and psychology at the [facility]..  On Adderall 30 mg every morning and 20 mg every day at lunch.   Current Outpatient Medications on File Prior to Visit  Medication Sig Dispense Refill  . amphetamine-dextroamphetamine (ADDERALL) 20 MG tablet Take 1 tablet by mouth daily with lunch.  0  . amphetamine-dextroamphetamine (ADDERALL) 30 MG tablet Take 1 tablet by mouth every morning.   0  . valACYclovir (VALTREX) 1000 MG tablet Take two pills, then take another two 12 hours later.  Use as needed for cold sore 28 tablet 0   No current facility-administered medications on file prior to visit.     No Known Allergies  Past Medical History:  Diagnosis Date  . Depression   . History of self mutilation    h/o admissions to behavioral health  . Menorrhagia     No past surgical history on file.  Family History  Problem Relation Age of Onset  . Heart disease Father 47       MI  . Cancer Maternal Grandmother 40       breast ca    Social History   Socioeconomic History  . Marital status: Single    Spouse name: Not on file  . Number of children: Not on file  . Years of education: Not on file  . Highest education level: Not on file  Occupational History  . Not on file  Social Needs  . Financial resource  strain: Not on file  . Food insecurity:    Worry: Not on file    Inability: Not on file  . Transportation needs:    Medical: Not on file    Non-medical: Not on file  Tobacco Use  . Smoking status: Current Every Day Smoker    Packs/day: 0.25    Types: Cigarettes  . Smokeless tobacco: Never Used  Substance and Sexual Activity  . Alcohol use: No    Alcohol/week: 0.0 oz  . Drug use: Yes    Types: Marijuana    Comment: occ  . Sexual activity: Not on file  Lifestyle  . Physical activity:    Days per week: Not on file    Minutes per session: Not on file  . Stress: Not on file  Relationships  . Social connections:    Talks on phone: Not on file    Gets together: Not on file    Attends religious service: Not on file    Active member of club or organization: Not on file    Attends meetings of clubs or organizations: Not on file    Relationship status: Not on file  . Intimate partner violence:    Fear of current or ex partner: Not on file  Emotionally abused: Not on file    Physically abused: Not on file    Forced sexual activity: Not on file  Other Topics Concern  . Not on file  Social History Narrative   Dropped out of school.   Going to get her GED, wants to go to community college.   Not sexually active.   Good relationship with her mother.   The PMH, PSH, Social History, Family History, Medications, and [facility], and have been updated if relevant.   Review of Systems  Constitutional: Negative.   HENT: Negative.   Eyes: Negative.   Respiratory: Negative.   Cardiovascular: Negative.   Gastrointestinal: Negative.   Endocrine: Negative.   Genitourinary: Positive for menstrual problem and vaginal discharge.  Skin: Negative.   Allergic/Immunologic: Negative.   Neurological: Negative.   Hematological: Negative.   Psychiatric/Behavioral: Negative.   All other systems reviewed and are negative.      Objective:    BP 108/66 (BP  Location: Left Arm, Patient Position: Sitting, Cuff Size: Normal)   Pulse (!) 101   Temp 98.6 F (37 C) (Oral)   Ht 5' 7.5" (1.715 m)   Wt 170 lb (77.1 kg)   LMP 09/03/2017   SpO2 98%   BMI 26.23 kg/m    Physical Exam   General:  Well-developed,well-nourished,in no acute distress; alert,appropriate and cooperative throughout examination Head:  normocephalic and atraumatic.   Eyes:  vision grossly intact, PERRL Ears:  R ear normal and L ear normal externally, TMs clear bilaterally Nose:  no external deformity.   Mouth:  good dentition.   Neck:  No deformities, masses, or tenderness noted. Breasts:  No mass, nodules, thickening, tenderness, bulging, retraction, inflamation, nipple discharge or skin changes noted.   Lungs:  Normal respiratory effort, chest expands symmetrically. Lungs are clear to auscultation, no crackles or wheezes. Heart:  Normal rate and regular rhythm. S1 and S2 normal without gallop, murmur, click, rub or other extra sounds. Abdomen:  Bowel sounds positive,abdomen soft and non-tender without masses, organomegaly or hernias noted. Rectal:  no external abnormalities.   Genitalia:  Pelvic Exam:        External: normal female genitalia without lesions or masses        Vagina: normal without lesions or masses        Cervix: normal without lesions or masses        Adnexa: normal bimanual exam without masses or fullness        Uterus: normal by palpation        Pap smear: performed Msk:  No deformity or scoliosis noted of thoracic or lumbar spine.   Extremities:  No clubbing, cyanosis, edema, or deformity noted with normal full range of motion of all joints.   Neurologic:  alert & oriented X3 and gait normal.   Skin:  Intact without suspicious lesions or rashes Cervical Nodes:  No lymphadenopathy noted Axillary Nodes:  No palpable lymphadenopathy Psych:  Cognition and judgment appear intact. Alert and cooperative with normal attention span and concentration. No  apparent delusions, illusions, hallucinations       Assessment & Plan:   Encounter for other general counseling or advice on contraception  Well woman exam with routine gynecological exam - Plan: Cytology - PAP  Attention deficit disorder, unspecified hyperactivity presence  Vaginal discharge - Plan: Cytology - PAP No follow-ups on file.

## 2017-09-24 NOTE — Patient Instructions (Signed)
Great to see you. I will call you with your lab results from today and you can view them online.   

## 2017-09-24 NOTE — Assessment & Plan Note (Signed)
Discussed dangers of smoking, alcohol, and drug abuse.  Also discussed sexual activity, pregnancy risk, and STD risk.  Encouraged to get regular exercise and a balanced diet.  Discussed immunizations and they have also been updated in the chart.  Pap smear today.  Labs today.

## 2017-09-24 NOTE — Assessment & Plan Note (Signed)
On nuvaring.

## 2017-09-24 NOTE — Assessment & Plan Note (Signed)
Followed by psychiatry and psychology.

## 2017-09-24 NOTE — Assessment & Plan Note (Signed)
Improved with nuvaring.  Will check labs today to rule out anemia and other possible contributing factors.

## 2017-09-25 LAB — RPR: RPR Ser Ql: NONREACTIVE

## 2017-09-25 LAB — HIV ANTIBODY (ROUTINE TESTING W REFLEX): HIV 1&2 Ab, 4th Generation: NONREACTIVE

## 2017-09-26 ENCOUNTER — Telehealth: Payer: Self-pay

## 2017-09-26 LAB — CYTOLOGY - PAP
Bacterial vaginitis: POSITIVE — AB
Candida vaginitis: NEGATIVE
Chlamydia: NEGATIVE
Diagnosis: NEGATIVE
Neisseria Gonorrhea: NEGATIVE
TRICH (WINDOWPATH): NEGATIVE

## 2017-09-26 LAB — CERVICOVAGINAL ANCILLARY ONLY: Herpes: NEGATIVE

## 2017-09-26 NOTE — Telephone Encounter (Signed)
PEC-Ok to give lab results  LMOVM that all labs returned were WNL and if she needs any details she may call back for that/thx dmf

## 2017-09-26 NOTE — Telephone Encounter (Signed)
-----   Message from Dianne Dunalia M Aron, MD sent at 09/26/2017  8:28 AM EDT ----- Please let pt know that her cbc, thyroid function, liver function, kidney function and electrolytes look excellent. Keep up the good work. HIV and RPR neg as well.

## 2017-09-27 ENCOUNTER — Other Ambulatory Visit: Payer: Self-pay | Admitting: Nurse Practitioner

## 2017-09-27 DIAGNOSIS — N760 Acute vaginitis: Principal | ICD-10-CM

## 2017-09-27 DIAGNOSIS — B9689 Other specified bacterial agents as the cause of diseases classified elsewhere: Secondary | ICD-10-CM

## 2017-09-27 MED ORDER — METRONIDAZOLE 500 MG PO TABS: 500.0000 mg | ORAL_TABLET | Freq: Two times a day (BID) | ORAL | 0 refills | Status: DC

## 2018-08-05 NOTE — Progress Notes (Signed)
Virtual Visit via Video   Due to the COVID-19 pandemic, this visit was completed with telemedicine (audio/video) technology to reduce patient and provider exposure as well as to preserve personal protective equipment.   I connected with Nancy Rojas by a video enabled telemedicine application and verified that I am speaking with the correct person using two identifiers. Location patient: Home Location provider: [facility], Office Persons participating in the virtual visit: Nancy Rojas, Ruthe Mannan, MD   I discussed the limitations of evaluation and management by telemedicine and the availability of in person appointments. The patient expressed understanding and agreed to proceed.  Care Team   Patient Care Team: Dianne Dun, MD as PCP - General (Family Medicine)  Subjective:   HPI:   ADD- she would like for me to take over prescribing her Adderall.  She has been on Adderall for ADD and extreme fatigue since 02/2017.  She states that the provider who has been prescribing it has had problems with charging her insurance for visits that never occurred.  PDMP reviewed- no red flags. Last filled 20mg  on 4.7.20 and 30mg  on 4.28.20. No obvious red flags.   Review of Systems  Constitutional: Negative.   Skin: Negative.   Neurological: Negative.   Psychiatric/Behavioral: Negative.   All other systems reviewed and are negative.    Patient Active Problem List   Diagnosis Date Noted  . Vaginal discharge 09/24/2017  . Contraception management 11/04/2013  . ADD (attention deficit disorder) 11/04/2013  . History of self mutilation   . Depression   . Menorrhagia     Social History   Tobacco Use  . Smoking status: Current Every Day Smoker    Packs/day: 0.25    Types: Cigarettes  . Smokeless tobacco: Never Used  Substance Use Topics  . Alcohol use: No    Alcohol/week: 0.0 standard drinks    Current Outpatient Medications:  .  amphetamine-dextroamphetamine  (ADDERALL) 20 MG tablet, Take 1 tablet (20 mg total) by mouth daily with lunch., Disp: 30 tablet, Rfl: 0 .  amphetamine-dextroamphetamine (ADDERALL) 30 MG tablet, Take 1 tablet by mouth every morning., Disp: 30 tablet, Rfl: 0 .  etonogestrel-ethinyl estradiol (NUVARING) 0.12-0.015 MG/24HR vaginal ring, Insert vaginally and leave in place for 3 consecutive weeks, then remove for 1 week., Disp: 3 each, Rfl: 3 .  valACYclovir (VALTREX) 1000 MG tablet, Take two pills, then take another two 12 hours later.  Use as needed for cold sore, Disp: 28 tablet, Rfl: 0  No Known Allergies  Objective:  There were no vitals taken for this visit.  VITALS: Per patient if applicable, see vitals. GENERAL: Alert, appears well and in no acute distress. HEENT: Atraumatic, conjunctiva clear, no obvious abnormalities on inspection of external nose and ears. NECK: Normal movements of the head and neck. CARDIOPULMONARY: No increased WOB. Speaking in clear sentences. I:E ratio WNL.  MS: Moves all visible extremities without noticeable abnormality. PSYCH: Pleasant and cooperative, well-groomed. Speech normal rate and rhythm. Affect is appropriate. Insight and judgement are appropriate. Attention is focused, linear, and appropriate.  NEURO: CN grossly intact. Oriented as arrived to appointment on time with no prompting. Moves both UE equally.  SKIN: No obvious lesions, wounds, erythema, or cyanosis noted on face or hands.    Assessment and Plan:   Nancy Rojas was seen today for follow-up.  Diagnoses and all orders for this visit:  Attention deficit disorder, unspecified hyperactivity presence  Cold sore -     valACYclovir (VALTREX)  1000 MG tablet; Take two pills, then take another two 12 hours later.  Use as needed for cold sore  Other orders -     amphetamine-dextroamphetamine (ADDERALL) 20 MG tablet; Take 1 tablet (20 mg total) by mouth daily with lunch. -     amphetamine-dextroamphetamine (ADDERALL) 30 MG tablet;  Take 1 tablet by mouth every morning.    Marland Kitchen. COVID-19 Education: The signs and symptoms of COVID-19 were discussed with the patient and how to seek care for testing if needed. The importance of social distancing was discussed today. . Reviewed expectations re: course of current medical issues. . Discussed self-management of symptoms. . Outlined signs and symptoms indicating need for more acute intervention. . Patient verbalized understanding and all questions were answered. Marland Kitchen. Health Maintenance issues including appropriate healthy diet, exercise, and smoking avoidance were discussed with patient. . See orders for this visit as documented in the electronic medical record.  Ruthe Mannanalia Jerrid Forgette, MD  Records requested if needed. Time spent: 15 minutes, of which >50% was spent in obtaining information about her symptoms, reviewing her previous labs, evaluations, and treatments, counseling her about her condition (please see the discussed topics above), and developing a plan to further investigate it; she had a number of questions which I addressed.

## 2018-08-06 ENCOUNTER — Telehealth (INDEPENDENT_AMBULATORY_CARE_PROVIDER_SITE_OTHER): Payer: Federal, State, Local not specified - PPO | Admitting: Family Medicine

## 2018-08-06 DIAGNOSIS — F988 Other specified behavioral and emotional disorders with onset usually occurring in childhood and adolescence: Secondary | ICD-10-CM | POA: Diagnosis not present

## 2018-08-06 DIAGNOSIS — B001 Herpesviral vesicular dermatitis: Secondary | ICD-10-CM

## 2018-08-06 MED ORDER — VALACYCLOVIR HCL 1 G PO TABS: ORAL_TABLET | ORAL | 0 refills | Status: DC

## 2018-08-06 MED ORDER — AMPHETAMINE-DEXTROAMPHETAMINE 30 MG PO TABS: 1.0000 | ORAL_TABLET | Freq: Every morning | ORAL | 0 refills | Status: DC

## 2018-08-06 MED ORDER — AMPHETAMINE-DEXTROAMPHETAMINE 20 MG PO TABS: 20.0000 mg | ORAL_TABLET | Freq: Every day | ORAL | 0 refills | Status: DC

## 2018-08-06 NOTE — Assessment & Plan Note (Signed)
>  15 minutes spent in face to face time with patient, >50% spent in counselling or coordination of care.  Explained to her that I will refill her adderall today but she does need to come in before her next refill is due for a UDS/CSC. The patient indicates understanding of these issues and agrees with the plan.

## 2018-08-22 ENCOUNTER — Telehealth: Payer: Self-pay

## 2018-08-22 NOTE — Telephone Encounter (Signed)
Copied from [place] [id number]. Topic: Appointment Scheduling - Scheduling Inquiry for Clinic >> Aug 22, 2018 10:46 AM Margot Ables wrote: Reason for CRM: pt called to schedule UDS. No order in system. Please advise.

## 2018-08-27 ENCOUNTER — Other Ambulatory Visit: Payer: Self-pay

## 2018-08-27 DIAGNOSIS — F988 Other specified behavioral and emotional disorders with onset usually occurring in childhood and adolescence: Secondary | ICD-10-CM

## 2018-08-27 DIAGNOSIS — Z79899 Other long term (current) drug therapy: Secondary | ICD-10-CM

## 2018-08-27 NOTE — Telephone Encounter (Signed)
Created future order for UDS/she is scheduled for Friday at 10am/thx dmf

## 2018-08-29 ENCOUNTER — Other Ambulatory Visit (INDEPENDENT_AMBULATORY_CARE_PROVIDER_SITE_OTHER): Payer: Federal, State, Local not specified - PPO

## 2018-08-29 ENCOUNTER — Other Ambulatory Visit: Payer: Self-pay

## 2018-08-29 DIAGNOSIS — Z79899 Other long term (current) drug therapy: Secondary | ICD-10-CM

## 2018-08-29 DIAGNOSIS — F988 Other specified behavioral and emotional disorders with onset usually occurring in childhood and adolescence: Secondary | ICD-10-CM | POA: Diagnosis not present

## 2018-08-31 LAB — PAIN MGMT, PROFILE 8 W/CONF, U
6 Acetylmorphine: NEGATIVE ng/mL
Alcohol Metabolites: POSITIVE ng/mL — AB (ref <500)
Amphetamine: 14215 ng/mL
Amphetamines: POSITIVE ng/mL
Benzodiazepines: NEGATIVE ng/mL
Buprenorphine, Urine: NEGATIVE ng/mL
Cocaine Metabolite: NEGATIVE ng/mL
Creatinine: 83.1 mg/dL
Ethyl Glucuronide (ETG): 11053 ng/mL
Ethyl Sulfate (ETS): 1188 ng/mL
MDMA: NEGATIVE ng/mL
Marijuana Metabolite: NEGATIVE ng/mL
Methamphetamine: NEGATIVE ng/mL
Opiates: NEGATIVE ng/mL
Oxidant: NEGATIVE ug/mL
Oxycodone: NEGATIVE ng/mL
pH: 5.4 (ref 4.5–9.0)

## 2018-09-10 ENCOUNTER — Other Ambulatory Visit: Payer: Self-pay | Admitting: Family Medicine

## 2018-09-10 NOTE — Telephone Encounter (Signed)
Copied from [place] [id number]. Topic: Quick Communication - Rx Refill/Question >> Sep 10, 2018  4:00 PM Leward Quan A wrote: Medication: amphetamine-dextroamphetamine (ADDERALL) 30 MG tablet, amphetamine-dextroamphetamine (ADDERALL) 20 MG tablet   Has the patient contacted their pharmacy? Yes.   (Agent: If no, request that the patient contact the pharmacy for the refill.) (Agent: If yes, when and what did the pharmacy advise?)  Preferred Pharmacy (with phone number or street name): [id number] [place], [place] AT [address] [phone] (Phone) [phone] (Fax)    Agent: Please be advised that RX refills may take up to 3 business days. We ask that you follow-up with your pharmacy.

## 2018-09-12 MED ORDER — AMPHETAMINE-DEXTROAMPHETAMINE 30 MG PO TABS: 1.0000 | ORAL_TABLET | Freq: Every morning | ORAL | 0 refills | Status: DC

## 2018-09-12 MED ORDER — AMPHETAMINE-DEXTROAMPHETAMINE 20 MG PO TABS: 20.0000 mg | ORAL_TABLET | Freq: Every day | ORAL | 0 refills | Status: DC

## 2018-10-14 ENCOUNTER — Other Ambulatory Visit: Payer: Self-pay | Admitting: Family Medicine

## 2018-10-14 NOTE — Telephone Encounter (Signed)
amphetamine-dextroamphetamine (ADDERALL) 20 MG tablet  amphetamine-dextroamphetamine (ADDERALL) 30 MG tablet   [facility] to [facility]

## 2018-10-15 MED ORDER — AMPHETAMINE-DEXTROAMPHETAMINE 20 MG PO TABS: 20.0000 mg | ORAL_TABLET | Freq: Every day | ORAL | 0 refills | Status: DC

## 2018-10-15 MED ORDER — AMPHETAMINE-DEXTROAMPHETAMINE 30 MG PO TABS: 1.0000 | ORAL_TABLET | Freq: Every morning | ORAL | 0 refills | Status: DC

## 2018-11-03 ENCOUNTER — Other Ambulatory Visit: Payer: Self-pay | Admitting: Family Medicine

## 2018-11-03 NOTE — Telephone Encounter (Signed)
Requested medication (s) are due for refill today: yes  Requested medication (s) are on the active medication list: no  Last refill:   Future visit scheduled: no Notes to clinic:  Review for refill   Requested Prescriptions  Pending Prescriptions Disp Refills   etonogestrel-ethinyl estradiol (NUVARING) 0.12-0.015 MG/24HR vaginal ring 3 each 3    Sig: Insert vaginally and leave in place for 3 consecutive weeks, then remove for 1 week.     There is no refill protocol information for this order

## 2018-11-03 NOTE — Telephone Encounter (Signed)
Medication Refill - Medication: etonogestrel-ethinyl estradiol (NUVARING) 0.12-0.015 MG/24HR vaginal ring    Has the patient contacted their pharmacy? Yes.   (Agent: If no, request that the patient contact the pharmacy for the refill.) (Agent: If yes, when and what did the pharmacy advise?)  Preferred Pharmacy (with phone number or street name):  [id number] [place], [place] [phone] (Phone) [phone] (Fax)     Agent: Please be advised that RX refills may take up to 3 business days. We ask that you follow-up with your pharmacy.

## 2018-11-05 MED ORDER — ETONOGESTREL-ETHINYL ESTRADIOL 0.12-0.015 MG/24HR VA RING: VAGINAL_RING | VAGINAL | 3 refills | Status: DC

## 2018-11-21 ENCOUNTER — Other Ambulatory Visit: Payer: Self-pay | Admitting: Family Medicine

## 2018-11-21 MED ORDER — AMPHETAMINE-DEXTROAMPHETAMINE 20 MG PO TABS: 20.0000 mg | ORAL_TABLET | Freq: Every day | ORAL | 0 refills | Status: DC

## 2018-11-21 MED ORDER — AMPHETAMINE-DEXTROAMPHETAMINE 30 MG PO TABS: 1.0000 | ORAL_TABLET | Freq: Every morning | ORAL | 0 refills | Status: DC

## 2018-11-21 NOTE — Telephone Encounter (Signed)
amphetamine-dextroamphetamine (ADDERALL) 20 MG tablet  amphetamine-dextroamphetamine (ADDERALL) 30 MG tablet   [facility]

## 2018-11-21 NOTE — Telephone Encounter (Signed)
Requested medication (s) are due for refill today: yes  Requested medication (s) are on the active medication list: yes  Last refill:  10/15/2018  Future visit scheduled: no  Notes to clinic:  Refill cannot be delegated    Requested Prescriptions  Pending Prescriptions Disp Refills   amphetamine-dextroamphetamine (ADDERALL) 20 MG tablet 30 tablet 0    Sig: Take 1 tablet (20 mg total) by mouth daily with lunch.     Not Delegated - Psychiatry:  Stimulants/ADHD Failed - 11/21/2018  2:16 PM      Failed - This refill cannot be delegated      Failed - Valid encounter within last 3 months    Recent Outpatient Visits          3 months ago Attention deficit disorder, unspecified hyperactivity presence   [facility], Marciano Sequin, MD   1 year ago Well woman exam with routine gynecological exam   [facility], Marciano Sequin, MD   4 years ago Cold sore   [facility], Gay Filler, MD   4 years ago Streptococcal sore throat   [facility], Janalee Dane, MD             Passed - Urine Drug Screen completed in last 360 days.       amphetamine-dextroamphetamine (ADDERALL) 30 MG tablet 30 tablet 0    Sig: Take 1 tablet by mouth every morning.     Not Delegated - Psychiatry:  Stimulants/ADHD Failed - 11/21/2018  2:16 PM      Failed - This refill cannot be delegated      Failed - Valid encounter within last 3 months    Recent Outpatient Visits          3 months ago Attention deficit disorder, unspecified hyperactivity presence   [facility], Marciano Sequin, MD   1 year ago Well woman exam with routine gynecological exam   [facility], Marciano Sequin, MD   4 years ago Cold sore   [facility] at [facility], Gay Filler, MD   4 years ago Streptococcal sore throat   [facility], Janalee Dane, MD             Passed - Urine Drug Screen completed in  last 360 days.

## 2019-01-01 ENCOUNTER — Other Ambulatory Visit: Payer: Self-pay | Admitting: Family Medicine

## 2019-01-01 NOTE — Telephone Encounter (Signed)
Medication Refill - Medication:   amphetamine-dextroamphetamine (ADDERALL) 20 MG tablet    amphetamine-dextroamphetamine (ADDERALL) 30 MG tablet    etonogestrel-ethinyl estradiol (NUVARING) 0.12-0.015 MG/24HR vaginal ring        Preferred Pharmacy (with phone number or street name):  [id number] [address], [place] [phone] (Phone) [phone] (Fax     Agent: Please be advised that RX refills may take up to 3 business days. We ask that you follow-up with your pharmacy.

## 2019-01-01 NOTE — Telephone Encounter (Signed)
Requested medication (s) are due for refill today: yes  Requested medication (s) are on the active medication list: yes  Last refill:  9 /2020  Future visit scheduled: no  Notes to clinic: review for refill   Requested Prescriptions  Pending Prescriptions Disp Refills   amphetamine-dextroamphetamine (ADDERALL) 20 MG tablet 30 tablet 0    Sig: Take 1 tablet (20 mg total) by mouth daily with lunch.     Not Delegated - Psychiatry:  Stimulants/ADHD Failed - 01/01/2019  3:05 PM      Failed - This refill cannot be delegated      Failed - Valid encounter within last 3 months    Recent Outpatient Visits          4 months ago Attention deficit disorder, unspecified hyperactivity presence   [facility], Marciano Sequin, MD   1 year ago Well woman exam with routine gynecological exam   [facility], Marciano Sequin, MD   4 years ago Cold sore   [facility], Gay Filler, MD   4 years ago Streptococcal sore throat   [facility], Janalee Dane, MD             Passed - Urine Drug Screen completed in last 360 days.       amphetamine-dextroamphetamine (ADDERALL) 30 MG tablet 30 tablet 0    Sig: Take 1 tablet by mouth every morning.     Not Delegated - Psychiatry:  Stimulants/ADHD Failed - 01/01/2019  3:05 PM      Failed - This refill cannot be delegated      Failed - Valid encounter within last 3 months    Recent Outpatient Visits          4 months ago Attention deficit disorder, unspecified hyperactivity presence   [facility], Marciano Sequin, MD   1 year ago Well woman exam with routine gynecological exam   [facility] [facility], Marciano Sequin, MD   4 years ago Cold sore   [facility] at [facility], Gay Filler, MD   4 years ago Streptococcal sore throat   [facility], Janalee Dane, MD             Passed - Urine Drug Screen completed in last 360 days.        etonogestrel-ethinyl estradiol (NUVARING) 0.12-0.015 MG/24HR vaginal ring 3 each 3    Sig: Insert vaginally and leave in place for 3 consecutive weeks, then remove for 1 week.     There is no refill protocol information for this order

## 2019-01-02 MED ORDER — ETONOGESTREL-ETHINYL ESTRADIOL 0.12-0.015 MG/24HR VA RING: VAGINAL_RING | VAGINAL | 3 refills | Status: DC

## 2019-01-02 MED ORDER — AMPHETAMINE-DEXTROAMPHETAMINE 30 MG PO TABS
1.0000 | ORAL_TABLET | Freq: Every morning | ORAL | 0 refills | Status: DC
Start: 2019-01-02 — End: 2019-02-09

## 2019-01-02 MED ORDER — AMPHETAMINE-DEXTROAMPHETAMINE 20 MG PO TABS: 20.0000 mg | ORAL_TABLET | Freq: Every day | ORAL | 0 refills | Status: DC

## 2019-01-02 NOTE — Telephone Encounter (Signed)
Last fill for Adderalls 11/21/18 #30/0 Last fill for Ring 09/022/20 #3/3 Last OV 08/06/18

## 2019-02-09 ENCOUNTER — Other Ambulatory Visit: Payer: Self-pay | Admitting: Family Medicine

## 2019-02-09 MED ORDER — AMPHETAMINE-DEXTROAMPHETAMINE 30 MG PO TABS: 30.0000 mg | ORAL_TABLET | Freq: Every day | ORAL | 0 refills | Status: DC

## 2019-02-09 MED ORDER — AMPHETAMINE-DEXTROAMPHETAMINE 20 MG PO TABS: ORAL_TABLET | ORAL | 0 refills | Status: DC

## 2019-02-09 MED ORDER — AMPHETAMINE-DEXTROAMPHETAMINE 20 MG PO TABS: 20.0000 mg | ORAL_TABLET | Freq: Every day | ORAL | 0 refills | Status: DC

## 2019-02-09 MED ORDER — AMPHETAMINE-DEXTROAMPHETAMINE 30 MG PO TABS: 1.0000 | ORAL_TABLET | Freq: Every morning | ORAL | 0 refills | Status: DC

## 2019-02-09 NOTE — Telephone Encounter (Signed)
Requested medication (s) are due for refill today: yes Requested medication (s) are on the active medication list: yes  Last refill:  01/02/2019  Future visit scheduled: no  Notes to clinic:  Refill cannot be delegated    Requested Prescriptions  Pending Prescriptions Disp Refills   amphetamine-dextroamphetamine (ADDERALL) 20 MG tablet 30 tablet 0    Sig: Take 1 tablet (20 mg total) by mouth daily with lunch.     Not Delegated - Psychiatry:  Stimulants/ADHD Failed - 02/09/2019 11:04 AM      Failed - This refill cannot be delegated      Failed - Valid encounter within last 3 months    Recent Outpatient Visits          6 months ago Attention deficit disorder, unspecified hyperactivity presence   [facility], Marciano Sequin, MD   1 year ago Well woman exam with routine gynecological exam   [facility] [place] [facility], Marciano Sequin, MD   4 years ago Cold sore   [facility], Gay Filler, MD   4 years ago Streptococcal sore throat   [facility], Janalee Dane, MD             Passed - Urine Drug Screen completed in last 360 days.       amphetamine-dextroamphetamine (ADDERALL) 30 MG tablet 30 tablet 0    Sig: Take 1 tablet by mouth every morning.     Not Delegated - Psychiatry:  Stimulants/ADHD Failed - 02/09/2019 11:04 AM      Failed - This refill cannot be delegated      Failed - Valid encounter within last 3 months    Recent Outpatient Visits          6 months ago Attention deficit disorder, unspecified hyperactivity presence   [facility], Marciano Sequin, MD   1 year ago Well woman exam with routine gynecological exam   [facility] [place] [facility], Marciano Sequin, MD   4 years ago Cold sore   [facility] at [facility], Gay Filler, MD   4 years ago Streptococcal sore throat   [facility], Janalee Dane, MD             Passed - Urine Drug Screen completed in last  360 days.

## 2019-02-09 NOTE — Telephone Encounter (Signed)
Medication Refill - Medication:  amphetamine-dextroamphetamine (ADDERALL) 20 MG tablet    amphetamine-dextroamphetamine (ADDERALL) 30 MG tablet     Has the patient contacted their pharmacy? Yes.   (Agent: If no, request that the patient contact the pharmacy for the refill.) (Agent: If yes, when and what did the pharmacy advise?)  Preferred Pharmacy (with phone number or street name): [id number] [place], [place]: Please be advised that RX refills may take up to 3 business days. We ask that you follow-up with your pharmacy.

## 2019-02-09 NOTE — Telephone Encounter (Signed)
TA-Last filled 10.30.20 and is compliant/UDS & CSC are UTD/orders created for December, Jan & Feb and pended for your approval/thx dmf

## 2019-03-16 ENCOUNTER — Telehealth: Payer: Self-pay | Admitting: Family Medicine

## 2019-03-16 NOTE — Telephone Encounter (Signed)
I am not sure I understand. Is there a red flag?

## 2019-03-16 NOTE — Telephone Encounter (Signed)
Patient is calling and requesting a [facility] [address] CB is [phone].

## 2019-03-16 NOTE — Telephone Encounter (Signed)
Plz see refill req from 12.7.20/thx dmf

## 2019-03-18 NOTE — Telephone Encounter (Signed)
I LMOVM stating that on 12.7.20 Rx's for both doses of Adderall were sent in for Dec, Jan, ad Feb and to call the pharmacy and ask them to review their records/thx dmf

## 2019-05-25 ENCOUNTER — Telehealth: Payer: Self-pay | Admitting: General Practice

## 2019-05-25 NOTE — Telephone Encounter (Signed)
I called pt and LDM informing pt that Dr. Salena Saner is out of office until Thursday. Last OV 08/06/18 Last fill 02/09/19  #30/0 Next OV 06/02/19

## 2019-05-25 NOTE — Telephone Encounter (Signed)
Patient is calling and is requesting a [facility] [address] Pt has a TOC appointment on 3/30 with Dr. Salena Saner. CB is [phone]

## 2019-05-28 MED ORDER — AMPHETAMINE-DEXTROAMPHETAMINE 30 MG PO TABS: 1.0000 | ORAL_TABLET | Freq: Every morning | ORAL | 0 refills | Status: DC

## 2019-05-28 NOTE — Telephone Encounter (Signed)
Refilled sent.

## 2019-06-02 ENCOUNTER — Encounter: Payer: Self-pay | Admitting: Family Medicine

## 2019-06-02 ENCOUNTER — Ambulatory Visit: Payer: Federal, State, Local not specified - PPO | Admitting: Family Medicine

## 2019-06-02 ENCOUNTER — Other Ambulatory Visit: Payer: Self-pay

## 2019-06-02 VITALS — BP 134/82 | HR 99 | Temp 98.1°F | Ht 67.5 in | Wt 204.0 lb

## 2019-06-02 DIAGNOSIS — F419 Anxiety disorder, unspecified: Secondary | ICD-10-CM

## 2019-06-02 DIAGNOSIS — F329 Major depressive disorder, single episode, unspecified: Secondary | ICD-10-CM | POA: Diagnosis not present

## 2019-06-02 DIAGNOSIS — F32A Depression, unspecified: Secondary | ICD-10-CM

## 2019-06-02 MED ORDER — ESCITALOPRAM OXALATE 10 MG PO TABS: ORAL_TABLET | ORAL | 1 refills | Status: DC

## 2019-06-02 NOTE — Progress Notes (Signed)
**Note Nancy-Identified via Obfuscation** DEVETTA Rojas is a 24 y.o. female  Chief Complaint  Patient presents with  . Establish Care    Medcation discussion for Adderall    HPI: Nancy Rojas is a 24 y.o. female who is a former patient of Dr. Deborra Medina who presents today to discuss Adderall. Pt has been on med x 2 years at '30mg'$  qAM and '20mg'$  qPM. She was Rx'd the Adderall for tiredness and fatigue related to depression.  She recently had to have multiple dental fillings and dentist told pt it was d/t Adderall.  She states she has missed a lot of school due to lack of motivation to go. She is in cosmetology school and likes it but "has a hard time getting started". She states her house is a mess and she knows she should clean but does not. Sleep varies - sometimes too much, sometimes very hard to sleep. Appetite also varies.   She follows with a therapist and has an appt next week. She has been tried on prozac in the past but it made her "numb" and "stopped caring enough to try".   Depression screen [facility] 2/9 06/02/2019 10/11/2014 07/16/2014  Decreased Interest 2 0 3  Down, Depressed, Hopeless 2 0 3  PHQ - 2 Score 4 0 6  Altered sleeping 3 - 0  Tired, decreased energy 3 - 3  Change in appetite 3 - 0  Feeling bad or failure about yourself  0 - 1  Trouble concentrating 1 - 2  Moving slowly or fidgety/restless 0 - 0  Suicidal thoughts 1 - 1  PHQ-9 Score 15 - 13  Difficult doing work/chores Very difficult - Somewhat difficult   GAD 7 : Generalized Anxiety Score 06/02/2019  Nervous, Anxious, on Edge 2  Control/stop worrying 3  Worry too much - different things 3  Trouble relaxing 3  Restless 0  Easily annoyed or irritable 2  Afraid - awful might happen 2  Total GAD 7 Score 15  Anxiety Difficulty Somewhat difficult    Past Medical History:  Diagnosis Date  . Depression   . History of self mutilation    h/o admissions to behavioral health  . Menorrhagia     History reviewed. No pertinent surgical history.  Social  History   Socioeconomic History  . Marital status: Single    Spouse name: Not on file  . Number of children: Not on file  . Years of education: Not on file  . Highest education level: Not on file  Occupational History  . Not on file  Tobacco Use  . Smoking status: Former Smoker    Packs/day: 0.25    Types: Cigarettes  . Smokeless tobacco: Never Used  Substance and Sexual Activity  . Alcohol use: No    Alcohol/week: 0.0 standard drinks  . Drug use: Yes    Types: Marijuana    Comment: occ  . Sexual activity: Not on file  Other Topics Concern  . Not on file  Social History Narrative   Dropped out of school.   Going to get her GED, wants to go to community college.   Not sexually active.   Good relationship with her mother.   Social Determinants of Health   Financial Resource Strain:   . Difficulty of Paying Living Expenses:   Food Insecurity:   . Worried About Charity fundraiser in the Last Year:   . Arboriculturist in the Last Year:   Transportation Needs:   .  Lack of Transportation (Medical):   Marland Kitchen Lack of Transportation (Non-Medical):   Physical Activity:   . Days of Exercise per Week:   . Minutes of Exercise per Session:   Stress:   . Feeling of Stress :   Social Connections:   . Frequency of Communication with Friends and Family:   . Frequency of Social Gatherings with Friends and Family:   . Attends Religious Services:   . Active Member of Clubs or Organizations:   . Attends Archivist Meetings:   Marland Kitchen Marital Status:   Intimate Partner Violence:   . Fear of Current or Ex-Partner:   . Emotionally Abused:   Marland Kitchen Physically Abused:   . Sexually Abused:     Family History  Problem Relation Age of Onset  . Heart disease Father 92       MI  . Cancer Maternal Grandmother 40       breast ca     Immunization History  Administered Date(s) Administered  . DTaP 01/31/1994, 03/26/1994, 06/14/1994, 07/03/1996, 09/23/1998  . Hepatitis A 10/25/2005,  07/11/2006  . Hepatitis B 1996-01[birth year], 01/03/1994, 06/14/1994  . HiB (PRP-OMP) 01/30/1994, 03/26/1994, 06/14/1994, 07/03/1996  . IPV 01/30/1994, 03/26/1994, 06/14/1994, 09/23/1998  . MMR 07/03/1996, 09/23/1998  . Meningococcal Conjugate 10/25/2005  . Tdap 10/25/2005, 03/22/2017    Outpatient Encounter Medications as of 06/02/2019  Medication Sig  . etonogestrel-ethinyl estradiol (NUVARING) 0.12-0.015 MG/24HR vaginal ring Insert vaginally and leave in place for 3 consecutive weeks, then remove for 1 week.  . valACYclovir (VALTREX) 1000 MG tablet Take two pills, then take another two 12 hours later.  Use as needed for cold sore  . [DISCONTINUED] amphetamine-dextroamphetamine (ADDERALL) 20 MG tablet Take 1qd with lunch  . [DISCONTINUED] amphetamine-dextroamphetamine (ADDERALL) 30 MG tablet Take 1 tablet by mouth daily.  Marland Kitchen escitalopram (LEXAPRO) 10 MG tablet 1/2 tab po daily x 1 week then 1 tab po daily  . [DISCONTINUED] amphetamine-dextroamphetamine (ADDERALL) 20 MG tablet Take 1 tablet (20 mg total) by mouth daily with lunch.  . [DISCONTINUED] amphetamine-dextroamphetamine (ADDERALL) 20 MG tablet Take 1qd with lunch  . [DISCONTINUED] amphetamine-dextroamphetamine (ADDERALL) 30 MG tablet Take 1 tablet by mouth daily.  . [DISCONTINUED] amphetamine-dextroamphetamine (ADDERALL) 30 MG tablet Take 1 tablet by mouth every morning.   No facility-administered encounter medications on file as of 06/02/2019.     ROS: Pertinent positives and negatives noted in HPI. Remainder of ROS non-contributory    No Known Allergies  BP 134/82   Pulse 99   Temp 98.1 F (36.7 C) (Temporal)   Ht 5' 7.5" (1.715 m)   Wt 204 lb (92.5 kg)   LMP  (LMP Unknown)   BMI 31.48 kg/m   Physical Exam  Constitutional: She is oriented to person, place, and time. She appears well-developed and well-nourished. No distress.  Cardiovascular: Normal rate and regular rhythm.  Pulmonary/Chest: No respiratory distress.   Neurological: She is alert and oriented to person, place, and time.  Psychiatric: She has a normal mood and affect. Her behavior is normal.     A/P:  1. Depression, unspecified depression type 2. Anxiety - PHQ-9 = 15, GAD-7 = 15 - pt was on adderall to help treat fatigue associated with depression, no true dx of ADD or ADHD. Pt will taper off Adderall. Rx: - escitalopram (LEXAPRO) 10 MG tablet; 1/2 tab po daily x 1 week then 1 tab po daily  Dispense: 90 tablet; Refill: 1 - discussed importance of regular exercise, healthy diet, adequate sleep -  f/u in 3-4 wks or sooner PRN Discussed plan and reviewed medications with patient, including risks, benefits, and potential side effects. Pt expressed understand. All questions answered    This visit occurred during the SARS-CoV-2 public health emergency.  Safety protocols were in place, including screening questions prior to the visit, additional usage of staff PPE, and extensive cleaning of exam room while observing appropriate contact time as indicated for disinfecting solutions.

## 2019-06-24 ENCOUNTER — Encounter: Payer: Self-pay | Admitting: Family Medicine

## 2019-06-24 ENCOUNTER — Telehealth (INDEPENDENT_AMBULATORY_CARE_PROVIDER_SITE_OTHER): Payer: Federal, State, Local not specified - PPO | Admitting: Family Medicine

## 2019-06-24 VITALS — Ht 67.5 in | Wt 205.0 lb

## 2019-06-24 DIAGNOSIS — F419 Anxiety disorder, unspecified: Secondary | ICD-10-CM

## 2019-06-24 DIAGNOSIS — F329 Major depressive disorder, single episode, unspecified: Secondary | ICD-10-CM

## 2019-06-24 DIAGNOSIS — F32A Depression, unspecified: Secondary | ICD-10-CM

## 2019-06-24 MED ORDER — ETONOGESTREL-ETHINYL ESTRADIOL 0.12-0.015 MG/24HR VA RING: VAGINAL_RING | VAGINAL | 3 refills | Status: DC

## 2019-06-24 NOTE — Progress Notes (Signed)
Virtual Visit via Video Note  I connected with [name] on 06/24/19 at  3:00 PM EDT by a video enabled telemedicine application and verified that I am speaking with the correct person using two identifiers. Location patient: home Location provider: work or home office Persons participating in the virtual visit: patient, provider  I discussed the limitations of evaluation and management by telemedicine and the availability of in person appointments. The patient expressed understanding and agreed to proceed.  Chief Complaint  Patient presents with  . Follow-up    3 week medication follow up     HPI: Nancy Rojas is a 24 y.o. female seen today for f/u on anxiety and depression. She was last seen by me on 3/30 and at that time she was started on lexapro 10mg  daily (5mg  x 1 week then 10 mg daily). She was previously on prozac and adderall. She was strongly advised by her dentist to d/c adderall as it caused multiple cavities and erosions. Today she states she "doesn't feel so down all of the time" and her overall "mood has improved".   Side effects - no  Depression screen [facility] 2/9 06/24/2019 06/02/2019 10/11/2014  Decreased Interest 0 2 0  Down, Depressed, Hopeless 0 2 0  PHQ - 2 Score 0 4 0  Altered sleeping 0 3 -  Tired, decreased energy 1 3 -  Change in appetite 0 3 -  Feeling bad or failure about yourself  0 0 -  Trouble concentrating 0 1 -  Moving slowly or fidgety/restless 0 0 -  Suicidal thoughts 0 1 -  PHQ-9 Score 1 15 -  Difficult doing work/chores Not difficult at all Very difficult -   GAD 7 : Generalized Anxiety Score 06/24/2019 06/02/2019  Nervous, Anxious, on Edge 1 2  Control/stop worrying 1 3  Worry too much - different things 1 3  Trouble relaxing 1 3  Restless 0 0  Easily annoyed or irritable 1 2  Afraid - awful might happen 0 2  Total GAD 7 Score 5 15  Anxiety Difficulty Somewhat difficult Somewhat difficult      Past Medical History:  Diagnosis  Date  . Depression   . History of self mutilation    h/o admissions to behavioral health  . Menorrhagia     History reviewed. No pertinent surgical history.  Family History  Problem Relation Age of Onset  . Heart disease Father 39       MI  . Cancer Maternal Grandmother 40       breast ca    Social History   Tobacco Use  . Smoking status: Former Smoker    Packs/day: 0.25    Types: Cigarettes  . Smokeless tobacco: Never Used  Substance Use Topics  . Alcohol use: No    Alcohol/week: 0.0 standard drinks  . Drug use: Yes    Types: Marijuana    Comment: occ     Current Outpatient Medications:  .  escitalopram (LEXAPRO) 10 MG tablet, 1/2 tab po daily x 1 week then 1 tab po daily, Disp: 90 tablet, Rfl: 1 .  etonogestrel-ethinyl estradiol (NUVARING) 0.12-0.015 MG/24HR vaginal ring, Insert vaginally and leave in place for 3 consecutive weeks, then remove for 1 week., Disp: 3 each, Rfl: 3 .  ibuprofen (ADVIL) 800 MG tablet, Take 800 mg by mouth every 8 (eight) hours as needed., Disp: , Rfl:  .  valACYclovir (VALTREX) 1000 MG tablet, Take two pills, then take another two 12  hours later.  Use as needed for cold sore, Disp: 28 tablet, Rfl: 0  No Known Allergies    ROS: See pertinent positives and negatives per HPI.   EXAM:  VITALS per patient if applicable: Ht 5' 7.5" (1.715 m)   Wt 205 lb (93 kg)   LMP 06/03/2019   BMI 31.63 kg/m    GENERAL: alert, oriented, appears well and in no acute distress  HEENT: atraumatic, conjunctiva clear, no obvious abnormalities on inspection of external nose and ears  NECK: normal movements of the head and neck  LUNGS: on inspection no signs of respiratory distress, breathing rate appears normal, no obvious gross SOB, gasping or wheezing, no conversational dyspnea  CV: no obvious cyanosis  PSYCH/NEURO: pleasant and cooperative, speech and thought processing grossly intact   ASSESSMENT AND PLAN:  1. Depression, unspecified  depression type 2. Anxiety - much improved on lexapro 10mg  daily - PHQ-9 = 1 (previous 15), GAD-7 = 5 (previously 15) - continue on current med - cont with healthy diet, regular exercise, adequate sleep - f/u PRN    I discussed the assessment and treatment plan with the patient. The patient was provided an opportunity to ask questions and all were answered. The patient agreed with the plan and demonstrated an understanding of the instructions.   The patient was advised to call back or seek an in-person evaluation if the symptoms worsen or if the condition fails to improve as anticipated.   Nancy Median, DO

## 2019-06-24 NOTE — Patient Instructions (Signed)
Health Maintenance Due  Topic Date Due  . COVID-19 Vaccine (1) Never done    Depression screen [facility] 2/9 06/02/2019 10/11/2014 07/16/2014  Decreased Interest 2 0 3  Down, Depressed, Hopeless 2 0 3  PHQ - 2 Score 4 0 6  Altered sleeping 3 - 0  Tired, decreased energy 3 - 3  Change in appetite 3 - 0  Feeling bad or failure about yourself  0 - 1  Trouble concentrating 1 - 2  Moving slowly or fidgety/restless 0 - 0  Suicidal thoughts 1 - 1  PHQ-9 Score 15 - 13  Difficult doing work/chores Very difficult - Somewhat difficult

## 2019-07-08 ENCOUNTER — Telehealth: Payer: Federal, State, Local not specified - PPO

## 2019-09-24 ENCOUNTER — Other Ambulatory Visit: Payer: Self-pay

## 2019-09-25 ENCOUNTER — Encounter: Payer: Self-pay | Admitting: Family Medicine

## 2019-09-25 ENCOUNTER — Ambulatory Visit: Payer: Federal, State, Local not specified - PPO | Admitting: Family Medicine

## 2019-09-25 VITALS — BP 120/90 | HR 107 | Temp 98.4°F | Ht 67.5 in | Wt 220.8 lb

## 2019-09-25 DIAGNOSIS — Z4802 Encounter for removal of sutures: Secondary | ICD-10-CM | POA: Diagnosis not present

## 2019-09-25 DIAGNOSIS — S0101XD Laceration without foreign body of scalp, subsequent encounter: Secondary | ICD-10-CM

## 2019-09-25 NOTE — Progress Notes (Signed)
 Nancy Rojas is a 23 y.o. female  Chief Complaint  Patient presents with  . Suture / Staple Removal    Pt fell on her head x 2weeks ago and recieved Stitches  now pt would like them removed today.l     HPI: Nancy Rojas is a 23 y.o. female who is here for f/u on for laceration on her head sustained on 09/15/19 when she fell out of a golf cart. She lost consciousness. She went to ER. She had head CT that was normal. Scalp laceration was sutured. She is unsure of how many sutures were placed.  She is here today for removal. No fever, chills. Head is still sore.   Past Medical History:  Diagnosis Date  . Depression   . History of self mutilation    h/o admissions to behavioral health  . Menorrhagia     History reviewed. No pertinent surgical history.  Social History   Socioeconomic History  . Marital status: Single    Spouse name: Not on file  . Number of children: Not on file  . Years of education: Not on file  . Highest education level: Not on file  Occupational History  . Not on file  Tobacco Use  . Smoking status: Former Smoker    Packs/day: 0.25    Types: Cigarettes  . Smokeless tobacco: Never Used  Substance and Sexual Activity  . Alcohol use: No    Alcohol/week: 0.0 standard drinks  . Drug use: Yes    Types: Marijuana    Comment: occ  . Sexual activity: Not on file  Other Topics Concern  . Not on file  Social History Narrative   Dropped out of school.   Going to get her GED, wants to go to community college.   Not sexually active.   Good relationship with her mother.   Social Determinants of Health   Financial Resource Strain:   . Difficulty of Paying Living Expenses:   Food Insecurity:   . Worried About Running Out of Food in the Last Year:   . Ran Out of Food in the Last Year:   Transportation Needs:   . Lack of Transportation (Medical):   . Lack of Transportation (Non-Medical):   Physical Activity:   . Days of Exercise per Week:   .  Minutes of Exercise per Session:   Stress:   . Feeling of Stress :   Social Connections:   . Frequency of Communication with Friends and Family:   . Frequency of Social Gatherings with Friends and Family:   . Attends Religious Services:   . Active Member of Clubs or Organizations:   . Attends Club or Organization Meetings:   . Marital Status:   Intimate Partner Violence:   . Fear of Current or Ex-Partner:   . Emotionally Abused:   . Physically Abused:   . Sexually Abused:     Family History  Problem Relation Age of Onset  . Heart disease Father 46       MI  . Cancer Maternal Grandmother 40       breast ca     Immunization History  Administered Date(s) Administered  . DTaP 01/31/1994, 03/26/1994, 06/14/1994, 07/03/1996, 09/23/1998  . Hepatitis A 10/25/2005, 07/11/2006  . Hepatitis B 05/19/1993, 01/03/1994, 06/14/1994  . HiB (PRP-OMP) 01/30/1994, 03/26/1994, 06/14/1994, 07/03/1996  . IPV 01/30/1994, 03/26/1994, 06/14/1994, 09/23/1998  . MMR 07/03/1996, 09/23/1998  . Meningococcal Conjugate 10/25/2005  . Tdap 10/25/2005, 03/22/2017    Outpatient   Encounter Medications as of 09/25/2019  Medication Sig  . escitalopram (LEXAPRO) 10 MG tablet 1/2 tab po daily x 1 week then 1 tab po daily  . etonogestrel-ethinyl estradiol (NUVARING) 0.12-0.015 MG/24HR vaginal ring Insert vaginally and leave in place for 3 consecutive weeks, then remove for 1 week.  . ibuprofen (ADVIL) 800 MG tablet Take 800 mg by mouth every 8 (eight) hours as needed.  . valACYclovir (VALTREX) 1000 MG tablet Take two pills, then take another two 12 hours later.  Use as needed for cold sore   No facility-administered encounter medications on file as of 09/25/2019.     ROS: Pertinent positives and negatives noted in HPI. Remainder of ROS non-contributory   No Known Allergies  BP (!) 120/90 (BP Location: Left Arm, Patient Position: Sitting, Cuff Size: Normal)   Pulse (!) 107   Temp 98.4 F (36.9 C)  (Temporal)   Ht 5' 7.5" (1.715 m)   Wt (!) 220 lb 12.8 oz (100.2 kg)   LMP 08/23/2019   SpO2 99%   BMI 34.07 kg/m   Physical Exam Constitutional:      General: She is not in acute distress.    Appearance: Normal appearance. She is not toxic-appearing.  Skin:    Findings: Laceration (well-healed laceration Rt posterior occiput, no signs of infection, sutures in place) present.  Neurological:     General: No focal deficit present.     Mental Status: She is alert and oriented to person, place, and time.  Psychiatric:        Behavior: Behavior normal.      A/P:  1. Encounter for removal of sutures 2. Laceration of scalp without foreign body, subsequent encounter - laceration well-healed, no sign of infection - 9 sutures removed without issue or complication and pt tolerated well - ok to wash hair, do not pick at scab - f/u PRN    This visit occurred during the SARS-CoV-2 public health emergency.  Safety protocols were in place, including screening questions prior to the visit, additional usage of staff PPE, and extensive cleaning of exam room while observing appropriate contact time as indicated for disinfecting solutions.  

## 2019-10-28 ENCOUNTER — Ambulatory Visit: Payer: Federal, State, Local not specified - PPO | Admitting: Family Medicine

## 2019-10-28 ENCOUNTER — Other Ambulatory Visit: Payer: Self-pay

## 2019-10-28 ENCOUNTER — Encounter: Payer: Self-pay | Admitting: Family Medicine

## 2019-10-28 VITALS — BP 120/76 | HR 102 | Temp 97.6°F | Ht 67.5 in | Wt 225.6 lb

## 2019-10-28 DIAGNOSIS — F988 Other specified behavioral and emotional disorders with onset usually occurring in childhood and adolescence: Secondary | ICD-10-CM

## 2019-10-28 DIAGNOSIS — S0101XD Laceration without foreign body of scalp, subsequent encounter: Secondary | ICD-10-CM | POA: Diagnosis not present

## 2019-10-28 MED ORDER — AMPHETAMINE-DEXTROAMPHETAMINE 20 MG PO TABS: 20.0000 mg | ORAL_TABLET | Freq: Two times a day (BID) | ORAL | 0 refills | Status: DC

## 2019-10-28 NOTE — Progress Notes (Signed)
Nancy Rojas is a 24 y.o. female  Chief Complaint  Patient presents with  . Follow-up    stitches removed an discuss getting back on Adderall    HPI: Nancy Rojas is a 24 y.o. female here for the following issues 1. She suffered a scalp laceration requiring ER visit and sutures in 09/2019 and was seen here by me in f/u for suture removal. At that time, pt has a significant amount of scabbing and dried blood on scalp and hair (she has not washed hair since prior to accident) and we did not have ER documentation of how many suture were placed. I told her to RTO if, once hair/scalped washed, she felt any sutures remained in place.  2. Pt was on lexapro 79m daily. She has not been taking it for a few weeks and states it "made her feel off". It did "even out [my] mood".  She was on adderall in the past but was encouraged to d/c by her dentist d/t multiple dental erosions. She was on 379mqAM and 2038mPM   Past Medical History:  Diagnosis Date  . Depression   . History of self mutilation    h/o admissions to behavioral health  . Menorrhagia     History reviewed. No pertinent surgical history.  Social History   Socioeconomic History  . Marital status: Single    Spouse name: Not on file  . Number of children: Not on file  . Years of education: Not on file  . Highest education level: Not on file  Occupational History  . Not on file  Tobacco Use  . Smoking status: Former Smoker    Packs/day: 0.25    Types: Cigarettes  . Smokeless tobacco: Never Used  Substance and Sexual Activity  . Alcohol use: No    Alcohol/week: 0.0 standard drinks  . Drug use: Yes    Types: Marijuana    Comment: occ  . Sexual activity: Not on file  Other Topics Concern  . Not on file  Social History Narrative   Dropped out of school.   Going to get her GED, wants to go to community college.   Not sexually active.   Good relationship with her mother.   Social Determinants of Health    Financial Resource Strain:   . Difficulty of Paying Living Expenses: Not on file  Food Insecurity:   . Worried About RunCharity fundraiser the Last Year: Not on file  . Ran Out of Food in the Last Year: Not on file  Transportation Needs:   . Lack of Transportation (Medical): Not on file  . Lack of Transportation (Non-Medical): Not on file  Physical Activity:   . Days of Exercise per Week: Not on file  . Minutes of Exercise per Session: Not on file  Stress:   . Feeling of Stress : Not on file  Social Connections:   . Frequency of Communication with Friends and Family: Not on file  . Frequency of Social Gatherings with Friends and Family: Not on file  . Attends Religious Services: Not on file  . Active Member of Clubs or Organizations: Not on file  . Attends CluArchivistetings: Not on file  . Marital Status: Not on file  Intimate Partner Violence:   . Fear of Current or Ex-Partner: Not on file  . Emotionally Abused: Not on file  . Physically Abused: Not on file  . Sexually Abused: Not on file  Family History  Problem Relation Age of Onset  . Heart disease Father 69       MI  . Cancer Maternal Grandmother 40       breast ca     Immunization History  Administered Date(s) Administered  . DTaP 01/31/1994, 03/26/1994, 06/14/1994, 07/03/1996, 09/23/1998  . Hepatitis A 10/25/2005, 07/11/2006  . Hepatitis B 11-28-93, 01/03/1994, 06/14/1994  . HiB (PRP-OMP) 01/30/1994, 03/26/1994, 06/14/1994, 07/03/1996  . IPV 01/30/1994, 03/26/1994, 06/14/1994, 09/23/1998  . MMR 07/03/1996, 09/23/1998  . Meningococcal Conjugate 10/25/2005  . Tdap 10/25/2005, 03/22/2017    Outpatient Encounter Medications as of 10/28/2019  Medication Sig  . escitalopram (LEXAPRO) 10 MG tablet 1/2 tab po daily x 1 week then 1 tab po daily  . etonogestrel-ethinyl estradiol (NUVARING) 0.12-0.015 MG/24HR vaginal ring Insert vaginally and leave in place for 3 consecutive weeks, then remove for 1  week.  Marland Kitchen ibuprofen (ADVIL) 800 MG tablet Take 800 mg by mouth every 8 (eight) hours as needed.  . valACYclovir (VALTREX) 1000 MG tablet Take two pills, then take another two 12 hours later.  Use as needed for cold sore   No facility-administered encounter medications on file as of 10/28/2019.     ROS: Pertinent positives and negatives noted in HPI. Remainder of ROS non-contributory  No Known Allergies  BP 120/76   Pulse (!) 102   Temp 97.6 F (36.4 C) (Temporal)   Ht 5' 7.5" (1.715 m)   Wt 225 lb 9.6 oz (102.3 kg)   SpO2 95%   BMI 34.81 kg/m   Physical Exam   A/P:  1. Attention deficit disorder, unspecified hyperactivity presence - pt was on adderall x 2+ years but stopped earlier this year d/t her dentist's concerns about enamel erosion - pt states symptoms are not well-controlled off med and would like to resume taking adderall. She was on 84m qAM and 217mqPM. - amphetamine-dextroamphetamine (ADDERALL) 20 MG tablet; Take 1 tablet (20 mg total) by mouth 2 (two) times daily.  Dispense: 60 tablet; Refill: 0 - database reviewed and appropriate - DRUG MONITORING, PANEL 8 WITH CONFIRMATION, URINE - will update controlled substance agreement once we determine effective dose - f/u in 3-4 wks or sooner PRN  2. Scalp laceration, subsequent encounter - healing well - 2 additional sutures removed today and pt tolerated without issue   This visit occurred during the SARS-CoV-2 public health emergency.  Safety protocols were in place, including screening questions prior to the visit, additional usage of staff PPE, and extensive cleaning of exam room while observing appropriate contact time as indicated for disinfecting solutions.

## 2019-10-28 NOTE — Patient Instructions (Signed)
[email or web address]  Affordable care act Health insurance marketplace

## 2019-10-30 ENCOUNTER — Telehealth: Payer: Self-pay

## 2019-10-30 LAB — DRUG MONITORING, PANEL 8 WITH CONFIRMATION, URINE
6 Acetylmorphine: NEGATIVE ng/mL (ref <10)
Alcohol Metabolites: POSITIVE ng/mL — AB
Amphetamines: NEGATIVE ng/mL (ref <500)
Benzodiazepines: NEGATIVE ng/mL (ref <100)
Buprenorphine, Urine: NEGATIVE ng/mL (ref <5)
Cocaine Metabolite: NEGATIVE ng/mL (ref <150)
Creatinine: 128.7 mg/dL
Ethyl Glucuronide (ETG): 38839 ng/mL — ABNORMAL HIGH (ref <500)
Ethyl Sulfate (ETS): 1596 ng/mL — ABNORMAL HIGH (ref <100)
MDMA: NEGATIVE ng/mL (ref <500)
Marijuana Metabolite: 549 ng/mL — ABNORMAL HIGH (ref <5)
Marijuana Metabolite: POSITIVE ng/mL — AB (ref <20)
Opiates: NEGATIVE ng/mL (ref <100)
Oxidant: NEGATIVE ug/mL
Oxycodone: NEGATIVE ng/mL (ref <100)
pH: 6.4 (ref 4.5–9.0)

## 2019-10-30 LAB — DM TEMPLATE

## 2019-10-30 NOTE — Telephone Encounter (Signed)
Pa was submitted for Adderall and Pa was approved.  Key: [employer]

## 2019-12-16 ENCOUNTER — Other Ambulatory Visit: Payer: Self-pay

## 2019-12-16 ENCOUNTER — Telehealth: Payer: Self-pay | Admitting: Family Medicine

## 2019-12-16 NOTE — Telephone Encounter (Signed)
Patient is calling and requesting a refill for Adderrall and [facility] [address], please advise. CB is [phone]

## 2019-12-16 NOTE — Telephone Encounter (Signed)
Called patient and she has enough medication to get to the appt scheduled on 12/17/19, since she was due for f/u from 8/25 appt. Patient agreeable to appt. Dm/cma

## 2019-12-17 ENCOUNTER — Ambulatory Visit: Payer: Federal, State, Local not specified - PPO | Admitting: Family Medicine

## 2019-12-17 ENCOUNTER — Encounter: Payer: Self-pay | Admitting: Family Medicine

## 2019-12-17 VITALS — BP 116/70 | HR 125 | Temp 98.3°F | Ht 67.5 in | Wt 212.0 lb

## 2019-12-17 DIAGNOSIS — Z2821 Immunization not carried out because of patient refusal: Secondary | ICD-10-CM

## 2019-12-17 DIAGNOSIS — F988 Other specified behavioral and emotional disorders with onset usually occurring in childhood and adolescence: Secondary | ICD-10-CM | POA: Diagnosis not present

## 2019-12-17 MED ORDER — AMPHETAMINE-DEXTROAMPHETAMINE 20 MG PO TABS: 20.0000 mg | ORAL_TABLET | Freq: Two times a day (BID) | ORAL | 0 refills | Status: DC

## 2019-12-17 NOTE — Progress Notes (Signed)
Nancy Rojas is a 24 y.o. female  Chief Complaint  Patient presents with  . Follow-up    f/u ADHD/med refills,declines flu and covid    HPI: Nancy Rojas is a 24 y.o. female seen for f/u on ADD. She was restarted on Adderall 40m BID in 10/2019. In the past, pt was on 348mqAM and 2039mPM. Today she reports she is taking 35m61mM around 9-10am and 10mg82m2 of 35mg 87m qPM around 3-4pm. Pt states her mood is better.  No issues with sleep. Appetite is ok. No side effects.   Pt declines flu vaccine. She has not had covid vaccine and does not plan to get it.  Past Medical History:  Diagnosis Date  . Depression   . History of self mutilation    h/o admissions to behavioral health  . Menorrhagia     History reviewed. No pertinent surgical history.  Social History   Socioeconomic History  . Marital status: Single    Spouse name: Not on file  . Number of children: Not on file  . Years of education: Not on file  . Highest education level: Not on file  Occupational History  . Not on file  Tobacco Use  . Smoking status: Former Smoker    Packs/day: 0.25    Types: Cigarettes  . Smokeless tobacco: Never Used  Substance and Sexual Activity  . Alcohol use: No    Alcohol/week: 0.0 standard drinks  . Drug use: Yes    Types: Marijuana    Comment: occ  . Sexual activity: Not on file  Other Topics Concern  . Not on file  Social History Narrative   Dropped out of school.   Going to get her GED, wants to go to community college.   Not sexually active.   Good relationship with her mother.   Social Determinants of Health   Financial Resource Strain:   . Difficulty of Paying Living Expenses: Not on file  Food Insecurity:   . Worried About RunninCharity fundraisere Last Year: Not on file  . Ran Out of Food in the Last Year: Not on file  Transportation Needs:   . Lack of Transportation (Medical): Not on file  . Lack of Transportation (Non-Medical): Not on file    Physical Activity:   . Days of Exercise per Week: Not on file  . Minutes of Exercise per Session: Not on file  Stress:   . Feeling of Stress : Not on file  Social Connections:   . Frequency of Communication with Friends and Family: Not on file  . Frequency of Social Gatherings with Friends and Family: Not on file  . Attends Religious Services: Not on file  . Active Member of Clubs or Organizations: Not on file  . Attends Club oArchivistngs: Not on file  . Marital Status: Not on file  Intimate Partner Violence:   . Fear of Current or Ex-Partner: Not on file  . Emotionally Abused: Not on file  . Physically Abused: Not on file  . Sexually Abused: Not on file    Family History  Problem Relation Age of Onset  . Heart disease Father 46    28 MI  . Cancer Maternal Grandmother 40       breast ca     Immunization History  Administered Date(s) Administered  . DTaP 01/31/1994, 03/26/1994, 06/14/1994, 07/03/1996, 09/23/1998  . Hepatitis A 10/25/2005, 07/11/2006  . Hepatitis B 11/23/01-05-1993  01/03/1994, 06/14/1994  . HiB (PRP-OMP) 01/30/1994, 03/26/1994, 06/14/1994, 07/03/1996  . IPV 01/30/1994, 03/26/1994, 06/14/1994, 09/23/1998  . MMR 07/03/1996, 09/23/1998  . Meningococcal Conjugate 10/25/2005  . Tdap 10/25/2005, 03/22/2017    Outpatient Encounter Medications as of 12/17/2019  Medication Sig  . amphetamine-dextroamphetamine (ADDERALL) 20 MG tablet Take 1 tablet (20 mg total) by mouth 2 (two) times daily.  Marland Kitchen etonogestrel-ethinyl estradiol (NUVARING) 0.12-0.015 MG/24HR vaginal ring Insert vaginally and leave in place for 3 consecutive weeks, then remove for 1 week.  Marland Kitchen ibuprofen (ADVIL) 800 MG tablet Take 800 mg by mouth every 8 (eight) hours as needed.  . valACYclovir (VALTREX) 1000 MG tablet Take two pills, then take another two 12 hours later.  Use as needed for cold sore  . [DISCONTINUED] amphetamine-dextroamphetamine (ADDERALL) 20 MG tablet Take 1 tablet (20 mg total)  by mouth 2 (two) times daily.   No facility-administered encounter medications on file as of 12/17/2019.     ROS: Pertinent positives and negatives noted in HPI. Remainder of ROS non-contributory    No Known Allergies  BP 116/70   Pulse (!) 125   Temp 98.3 F (36.8 C) (Temporal)   Ht 5' 7.5" (1.715 m)   Wt 212 lb (96.2 kg)   SpO2 98%   BMI 32.71 kg/m    BP Readings from Last 3 Encounters:  12/17/19 116/70  10/28/19 120/76  09/25/19 (!) 120/90   Pulse Readings from Last 3 Encounters:  12/17/19 (!) 125  10/28/19 (!) 102  09/25/19 (!) 107     Physical Exam Constitutional:      General: She is not in acute distress.    Appearance: Normal appearance. She is not ill-appearing.  Cardiovascular:     Rate and Rhythm: Regular rhythm. Tachycardia present.     Pulses: Normal pulses.     Heart sounds: Normal heart sounds.  Pulmonary:     Effort: No respiratory distress.     Breath sounds: No wheezing, rhonchi or rales.  Musculoskeletal:     Right lower leg: No edema.     Left lower leg: No edema.  Neurological:     General: No focal deficit present.     Mental Status: She is alert and oriented to person, place, and time.  Psychiatric:        Mood and Affect: Mood normal.        Behavior: Behavior normal.      A/P:  1. Attention deficit disorder, unspecified hyperactivity presence - stable, controlled - UDS UTD - database reviewed and appropriate Refill: - amphetamine-dextroamphetamine (ADDERALL) 20 MG tablet; Take 1 tablet (20 mg total) by mouth 2 (two) times daily.  Dispense: 180 tablet; Refill: 0 - 3 mo supply dispensed as pt's insurance runs out at end of this month and she would like to be able to get meds while still insured - f/u in 3 mo - pt does understand that she cannot continue to get med without regular q23mof/u  2. Influenza vaccination declined by patient    This visit occurred during the SARS-CoV-2 public health emergency.  Safety protocols  were in place, including screening questions prior to the visit, additional usage of staff PPE, and extensive cleaning of exam room while observing appropriate contact time as indicated for disinfecting solutions.

## 2019-12-23 ENCOUNTER — Telehealth: Payer: Self-pay | Admitting: Family Medicine

## 2019-12-23 DIAGNOSIS — B001 Herpesviral vesicular dermatitis: Secondary | ICD-10-CM

## 2019-12-23 MED ORDER — VALACYCLOVIR HCL 1 G PO TABS: ORAL_TABLET | ORAL | 0 refills | Status: DC

## 2019-12-23 NOTE — Telephone Encounter (Signed)
Last OV 12/17/19 Last fill 08/06/18  #28/0

## 2019-12-23 NOTE — Telephone Encounter (Signed)
Patient is calling to get a refill on her Valacyclovir. If approved, [facility] [address] and call her at [phone] to let her know that it has been sent in.

## 2019-12-23 NOTE — Telephone Encounter (Signed)
Rx sent to pharmacy requested.

## 2020-01-25 NOTE — Telephone Encounter (Signed)
Outcome Approvedon August 27 Your PA request has been approved. Additional information will be provided in the approval communication. (Message 1145) Drug Amphetamine-Dextroamphetamine 20MG  tablets

## 2020-04-19 ENCOUNTER — Other Ambulatory Visit: Payer: Self-pay | Admitting: Family Medicine

## 2020-04-19 DIAGNOSIS — F988 Other specified behavioral and emotional disorders with onset usually occurring in childhood and adolescence: Secondary | ICD-10-CM

## 2020-04-19 NOTE — Telephone Encounter (Signed)
Pt calling to get refill on amphetamine-dextroamphetamine (ADDERALL) 20 MG tablet. Please advise. [facility] [address]

## 2020-04-21 MED ORDER — AMPHETAMINE-DEXTROAMPHETAMINE 20 MG PO TABS: 20.0000 mg | ORAL_TABLET | Freq: Two times a day (BID) | ORAL | 0 refills | Status: DC

## 2020-04-21 NOTE — Telephone Encounter (Signed)
Last OV 12/17/19 Last fill 12/17/19

## 2020-04-21 NOTE — Telephone Encounter (Signed)
30 day supply sent. Needs appt prior to next refill

## 2020-04-24 DIAGNOSIS — Z4802 Encounter for removal of sutures: Secondary | ICD-10-CM | POA: Diagnosis not present

## 2020-04-27 NOTE — Telephone Encounter (Signed)
Called and left message for pt to call back and schedule

## 2020-06-07 ENCOUNTER — Other Ambulatory Visit: Payer: Self-pay

## 2020-06-07 NOTE — Patient Instructions (Signed)
Health Maintenance Due  Topic Date Due  . Hepatitis C Screening  Never done  . COVID-19 Vaccine (1) Never done  . HPV VACCINES (1 - 2-dose series) Never done    Depression screen [facility] 2/9 06/24/2019 06/02/2019 10/11/2014  Decreased Interest 0 2 0  Down, Depressed, Hopeless 0 2 0  PHQ - 2 Score 0 4 0  Altered sleeping 0 3 -  Tired, decreased energy 1 3 -  Change in appetite 0 3 -  Feeling bad or failure about yourself  0 0 -  Trouble concentrating 0 1 -  Moving slowly or fidgety/restless 0 0 -  Suicidal thoughts 0 1 -  PHQ-9 Score 1 15 -  Difficult doing work/chores Not difficult at all Very difficult -

## 2020-06-08 ENCOUNTER — Encounter: Payer: Self-pay | Admitting: Family Medicine

## 2020-06-08 ENCOUNTER — Ambulatory Visit: Payer: BC Managed Care – PPO | Admitting: Family Medicine

## 2020-06-08 VITALS — BP 119/70 | HR 102 | Temp 97.6°F | Ht 67.5 in | Wt 176.6 lb

## 2020-06-08 DIAGNOSIS — N946 Dysmenorrhea, unspecified: Secondary | ICD-10-CM

## 2020-06-08 DIAGNOSIS — F988 Other specified behavioral and emotional disorders with onset usually occurring in childhood and adolescence: Secondary | ICD-10-CM

## 2020-06-08 MED ORDER — AMPHETAMINE-DEXTROAMPHETAMINE 30 MG PO TABS: 30.0000 mg | ORAL_TABLET | Freq: Every morning | ORAL | 0 refills | Status: DC

## 2020-06-08 MED ORDER — AMPHETAMINE-DEXTROAMPHETAMINE 20 MG PO TABS: 20.0000 mg | ORAL_TABLET | Freq: Every day | ORAL | 0 refills | Status: DC

## 2020-06-08 NOTE — Progress Notes (Signed)
Nancy Rojas is a 25 y.o. female  Chief Complaint  Patient presents with  . Follow-up    Medication refill.  Pt c/o pain w/periods x last 6 months more frequently.  Also she would like a little spot on rt leg looked at, x 1year.    HPI: Nancy Rojas is a 25 y.o. female seen today for routine f/u on ADD and medication refill. Pt is taking Adderall 78m BID. She feels her symptoms were better controlled with 30qAM and 20qPM. She would like to go back to this dosing. No side effects noted. Sleep and appetite are at baseline. Denies HA, dizziness, vision changes, CP, SOB, palpitations.   She notes cramping associated with her periods for the past 6 mo. She is most concerned about pain associated with BM. This happens once every other month and likely less frequent. She notes either constipation or looser stools during period.   No change in menstrual flow, duration of period, etc.   She also wanted a spot on her Rt shin looked at. Present x 1+ year. No change. Not itchy, painful, sore. Started as a bug bite or pimple which went away but this slightly pink/red flat spot has remained.    Past Medical History:  Diagnosis Date  . Depression   . History of self mutilation    h/o admissions to behavioral health  . Menorrhagia     History reviewed. No pertinent surgical history.  Social History   Socioeconomic History  . Marital status: Single    Spouse name: Not on file  . Number of children: Not on file  . Years of education: Not on file  . Highest education level: Not on file  Occupational History  . Not on file  Tobacco Use  . Smoking status: Former Smoker    Packs/day: 0.25    Types: Cigarettes  . Smokeless tobacco: Never Used  Substance and Sexual Activity  . Alcohol use: No    Alcohol/week: 0.0 standard drinks  . Drug use: Yes    Types: Marijuana    Comment: occ  . Sexual activity: Not on file  Other Topics Concern  . Not on file  Social History Narrative    Dropped out of school.   Going to get her GED, wants to go to community college.   Not sexually active.   Good relationship with her mother.   Social Determinants of Health   Financial Resource Strain: Not on file  Food Insecurity: Not on file  Transportation Needs: Not on file  Physical Activity: Not on file  Stress: Not on file  Social Connections: Not on file  Intimate Partner Violence: Not on file    Family History  Problem Relation Age of Onset  . Heart disease Father 421      MI  . Cancer Maternal Grandmother 40       breast ca     Immunization History  Administered Date(s) Administered  . DTaP 01/31/1994, 03/26/1994, 06/14/1994, 07/03/1996, 09/23/1998  . Hepatitis A 10/25/2005, 07/11/2006  . Hepatitis B 01995-12-12 01/03/1994, 06/14/1994  . HiB (PRP-OMP) 01/30/1994, 03/26/1994, 06/14/1994, 07/03/1996  . IPV 01/30/1994, 03/26/1994, 06/14/1994, 09/23/1998  . MMR 07/03/1996, 09/23/1998  . Meningococcal Conjugate 10/25/2005  . Tdap 10/25/2005, 03/22/2017    Outpatient Encounter Medications as of 06/08/2020  Medication Sig  . amphetamine-dextroamphetamine (ADDERALL) 20 MG tablet Take 1 tablet (20 mg total) by mouth 2 (two) times daily.  .Marland Kitchenetonogestrel-ethinyl estradiol (NUVARING) 0.12-0.015 MG/24HR vaginal ring  Insert vaginally and leave in place for 3 consecutive weeks, then remove for 1 week.  Marland Kitchen ibuprofen (ADVIL) 800 MG tablet Take 800 mg by mouth every 8 (eight) hours as needed.  . valACYclovir (VALTREX) 1000 MG tablet Take two pills, then take another two 12 hours later.  Use as needed for cold sore   No facility-administered encounter medications on file as of 06/08/2020.     ROS: Pertinent positives and negatives noted in HPI. Remainder of ROS non-contributory   No Known Allergies  BP 119/70 (BP Location: Left Arm, Patient Position: Sitting, Cuff Size: Normal)   Pulse (!) 102   Temp 97.6 F (36.4 C) (Temporal)   Ht 5' 7.5" (1.715 m)   Wt 176 lb 9.6 oz  (80.1 kg)   LMP 06/06/2020   SpO2 97%   BMI 27.25 kg/m   Wt Readings from Last 3 Encounters:  06/08/20 176 lb 9.6 oz (80.1 kg)  12/17/19 212 lb (96.2 kg)  10/28/19 225 lb 9.6 oz (102.3 kg)   Temp Readings from Last 3 Encounters:  06/08/20 97.6 F (36.4 C) (Temporal)  12/17/19 98.3 F (36.8 C) (Temporal)  10/28/19 97.6 F (36.4 C) (Temporal)   BP Readings from Last 3 Encounters:  06/08/20 119/70  12/17/19 116/70  10/28/19 120/76   Pulse Readings from Last 3 Encounters:  06/08/20 (!) 102  12/17/19 (!) 125  10/28/19 (!) 102     Physical Exam Constitutional:      General: She is not in acute distress.    Appearance: Normal appearance. She is not ill-appearing.  Cardiovascular:     Rate and Rhythm: Normal rate and regular rhythm.     Pulses: Normal pulses.  Pulmonary:     Effort: Pulmonary effort is normal. No respiratory distress.     Breath sounds: Normal breath sounds.  Skin:      Neurological:     Mental Status: She is alert and oriented to person, place, and time.  Psychiatric:        Mood and Affect: Mood normal.        Behavior: Behavior normal.      A/P:   1. Attention deficit disorder, unspecified hyperactivity presence - not as well managed on adderall 35m BID, pt feels symptoms were better controlled on 354mqAM and 2026mPM Rx: - amphetamine-dextroamphetamine (ADDERALL) 20 MG tablet; Take 1 tablet (20 mg total) by mouth daily. 1 tab po daily in afternoon  Dispense: 30 tablet; Refill: 0 - amphetamine-dextroamphetamine (ADDERALL) 30 MG tablet; Take 1 tablet by mouth in the morning.  Dispense: 30 tablet; Refill: 0 - database reviewed and appropriate - UDS UTD - controlled substance agreement signed today - f/u in 3 mo or sooner PRN - VV is ok  2. Dysmenorrhea - not new but increased, manageable - pt notes change in stools during period - can be looser and more frequent or harder and less frequent BM. At times, maybe once every 2-37mo78mo has lower  abdominal pain with BM during her period - no red flag symptoms noted - supportive care and f/u PRN  This visit occurred during the SARS-CoV-2 public health emergency.  Safety protocols were in place, including screening questions prior to the visit, additional usage of staff PPE, and extensive cleaning of exam room while observing appropriate contact time as indicated for disinfecting solutions.

## 2020-06-26 ENCOUNTER — Other Ambulatory Visit: Payer: Self-pay | Admitting: Family Medicine

## 2020-06-27 NOTE — Telephone Encounter (Signed)
Last OV 06/08/20 Last fill 06/24/19  #3/3

## 2020-07-13 ENCOUNTER — Other Ambulatory Visit: Payer: Self-pay | Admitting: Family Medicine

## 2020-07-13 DIAGNOSIS — F988 Other specified behavioral and emotional disorders with onset usually occurring in childhood and adolescence: Secondary | ICD-10-CM

## 2020-07-13 MED ORDER — AMPHETAMINE-DEXTROAMPHETAMINE 30 MG PO TABS: 30.0000 mg | ORAL_TABLET | Freq: Every morning | ORAL | 0 refills | Status: DC

## 2020-07-13 MED ORDER — AMPHETAMINE-DEXTROAMPHETAMINE 20 MG PO TABS: 20.0000 mg | ORAL_TABLET | Freq: Every day | ORAL | 0 refills | Status: DC

## 2020-07-13 NOTE — Telephone Encounter (Signed)
Pt calling to get refill on  1. ADDERALL 20mg  2. ADDERALL 30mg  [facility] [address]

## 2020-07-13 NOTE — Telephone Encounter (Signed)
Last OV 06/08/20 Last fill 06/08/20  #30/0 for both.

## 2020-07-13 NOTE — Telephone Encounter (Signed)
Sent to provider for refill

## 2020-08-15 ENCOUNTER — Other Ambulatory Visit: Payer: Self-pay | Admitting: Family Medicine

## 2020-08-15 DIAGNOSIS — F988 Other specified behavioral and emotional disorders with onset usually occurring in childhood and adolescence: Secondary | ICD-10-CM

## 2020-08-15 NOTE — Telephone Encounter (Signed)
Patient is calling to get a refill on her 20mg  & 30mg  Adderall. She said that she's completely out. Patient is aware that Dr. is not in the office until Wednesday, but wants to know if another provider can send it in for her. If approved, please send in and call her at [phone].

## 2020-08-16 NOTE — Telephone Encounter (Signed)
Sent to provider 

## 2020-08-16 NOTE — Telephone Encounter (Signed)
Last OV 06/08/20 Last fill for both 07/13/20  #30/0

## 2020-08-17 MED ORDER — AMPHETAMINE-DEXTROAMPHETAMINE 30 MG PO TABS: 30.0000 mg | ORAL_TABLET | Freq: Every morning | ORAL | 0 refills | Status: DC

## 2020-08-17 MED ORDER — AMPHETAMINE-DEXTROAMPHETAMINE 20 MG PO TABS: 20.0000 mg | ORAL_TABLET | Freq: Every day | ORAL | 0 refills | Status: DC

## 2020-09-12 ENCOUNTER — Telehealth: Payer: Self-pay | Admitting: Family Medicine

## 2020-09-12 DIAGNOSIS — F988 Other specified behavioral and emotional disorders with onset usually occurring in childhood and adolescence: Secondary | ICD-10-CM

## 2020-09-12 NOTE — Telephone Encounter (Signed)
Last OV 06/08/20 Last fill for both 08/17/20  #30/0

## 2020-09-12 NOTE — Telephone Encounter (Signed)
What is the name of the medication? amphetamine-dextroamphetamine (ADDERALL) 20 MG tablet [[id number]] and   amphetamine-dextroamphetamine (ADDERALL) 30 MG tablet [[id number]]   Have you contacted your pharmacy to request a refill? Yes, she is needing a refill.   Which pharmacy would you like this sent to? Pharmacy  [id number], [facility] - [address]  [address] [place], [place] [address]  Phone:  [phone]  Fax:  [phone]  DEA #:  [id number]     Patient notified that their request is being sent to the clinical staff for review and that they should receive a call once it is complete. If they do not receive a call within 72 hours they can check with their pharmacy or our office.

## 2020-09-12 NOTE — Telephone Encounter (Signed)
Please see message and advise.  Thank you. ° °

## 2020-09-14 MED ORDER — AMPHETAMINE-DEXTROAMPHETAMINE 30 MG PO TABS: 30.0000 mg | ORAL_TABLET | Freq: Every morning | ORAL | 0 refills | Status: DC

## 2020-09-14 MED ORDER — AMPHETAMINE-DEXTROAMPHETAMINE 20 MG PO TABS: 20.0000 mg | ORAL_TABLET | Freq: Every day | ORAL | 0 refills | Status: DC

## 2020-09-14 NOTE — Telephone Encounter (Signed)
1 mo supply sent. Pt needs f/u appt prior to next refill

## 2020-09-20 NOTE — Telephone Encounter (Signed)
Lvm for pt to call back and schedule for future refills.

## 2020-10-21 DIAGNOSIS — J3489 Other specified disorders of nose and nasal sinuses: Secondary | ICD-10-CM | POA: Diagnosis not present

## 2020-10-21 DIAGNOSIS — Z20822 Contact with and (suspected) exposure to covid-19: Secondary | ICD-10-CM | POA: Diagnosis not present

## 2020-10-21 DIAGNOSIS — R0981 Nasal congestion: Secondary | ICD-10-CM | POA: Diagnosis not present

## 2020-10-21 DIAGNOSIS — H66001 Acute suppurative otitis media without spontaneous rupture of ear drum, right ear: Secondary | ICD-10-CM | POA: Diagnosis not present

## 2020-10-21 DIAGNOSIS — R059 Cough, unspecified: Secondary | ICD-10-CM | POA: Diagnosis not present

## 2021-02-10 ENCOUNTER — Encounter: Payer: Self-pay | Admitting: Family Medicine

## 2021-02-10 ENCOUNTER — Other Ambulatory Visit: Payer: Self-pay

## 2021-02-10 ENCOUNTER — Ambulatory Visit: Payer: BC Managed Care – PPO | Admitting: Family Medicine

## 2021-02-10 VITALS — BP 116/72 | HR 89 | Temp 96.4°F | Ht 67.0 in | Wt 181.2 lb

## 2021-02-10 DIAGNOSIS — Z3009 Encounter for other general counseling and advice on contraception: Secondary | ICD-10-CM | POA: Diagnosis not present

## 2021-02-10 DIAGNOSIS — Z0000 Encounter for general adult medical examination without abnormal findings: Secondary | ICD-10-CM

## 2021-02-10 DIAGNOSIS — F418 Other specified anxiety disorders: Secondary | ICD-10-CM

## 2021-02-10 MED ORDER — BUPROPION HCL ER (XL) 150 MG PO TB24: 150.0000 mg | ORAL_TABLET | Freq: Every day | ORAL | 1 refills | Status: DC

## 2021-02-10 MED ORDER — ETONOGESTREL-ETHINYL ESTRADIOL 0.12-0.015 MG/24HR VA RING: VAGINAL_RING | VAGINAL | 3 refills | Status: DC

## 2021-02-10 NOTE — Progress Notes (Signed)
Established Patient Office Visit  Subjective:  Patient ID: Nancy Rojas, female    DOB: Oct 19, 1995  Age: 25 y.o. MRN: [id number]  CC:  Chief Complaint  Patient presents with   Transitions Of Care    TOC from Dr. Barron Alvine, patient would like to start back on Adderall, would like a refill on birth control.     HPI Nancy Rojas presents for follow-up for prescriptions for Adderall and NuVARing. Nring has worked well for her in the past.  Cannot recall her last Pap smear.  LMP was 2 weeks ago.  She is not currently sexually active.  This is been a stressful time for her.  She had to move back in with her father and brother secondary to financial stress.  She is employed in Engineering geologist.  She has taken Adderall in the past mostly for the energy that seems to provide.  Does not really have it many issues with concentration.  She does admit anxiety with depression.  She is not certain that Adderall is the correct drug for her anyway.  Past Medical History:  Diagnosis Date   Depression    History of self mutilation    h/o admissions to behavioral health   Menorrhagia     No past surgical history on file.  Family History  Problem Relation Age of Onset   Heart disease Father 66       MI   Cancer Maternal Grandmother 59       breast ca    Social History   Socioeconomic History   Marital status: Single    Spouse name: Not on file   Number of children: Not on file   Years of education: Not on file   Highest education level: Not on file  Occupational History   Not on file  Tobacco Use   Smoking status: Former    Packs/day: 0.25    Types: Cigarettes   Smokeless tobacco: Never  Substance and Sexual Activity   Alcohol use: No    Alcohol/week: 0.0 standard drinks   Drug use: Yes    Types: Marijuana    Comment: occ   Sexual activity: Not on file  Other Topics Concern   Not on file  Social History Narrative   Dropped out of school.   Going to get her GED, wants to go to  community college.   Not sexually active.   Good relationship with her mother.   Social Determinants of Health   Financial Resource Strain: Not on file  Food Insecurity: Not on file  Transportation Needs: Not on file  Physical Activity: Not on file  Stress: Not on file  Social Connections: Not on file  Intimate Partner Violence: Not on file    Outpatient Medications Prior to Visit  Medication Sig Dispense Refill   valACYclovir (VALTREX) 1000 MG tablet Take two pills, then take another two 12 hours later.  Use as needed for cold sore 28 tablet 0   etonogestrel-ethinyl estradiol (NUVARING) 0.12-0.015 MG/24HR vaginal ring INSERT 1 RING VAGINALLY AS DIRECTED. REMOVE AFTER 3 WEEKS & WAIT 7 DAYS BEFORE INSERTING A NEW RING 3 each 3   ibuprofen (ADVIL) 800 MG tablet Take 800 mg by mouth every 8 (eight) hours as needed.     amphetamine-dextroamphetamine (ADDERALL) 20 MG tablet Take 1 tablet (20 mg total) by mouth daily. 1 tab po daily in afternoon 30 tablet 0   amphetamine-dextroamphetamine (ADDERALL) 30 MG tablet Take 1 tablet by mouth in the  morning. 30 tablet 0   No facility-administered medications prior to visit.    No Known Allergies  ROS Review of Systems  Constitutional:  Negative for diaphoresis, fatigue, fever and unexpected weight change.  HENT: Negative.    Respiratory: Negative.    Cardiovascular: Negative.   Gastrointestinal: Negative.   Genitourinary: Negative.   Musculoskeletal:  Negative for gait problem and joint swelling.     Depression screen [facility] 2/9 02/10/2021 02/10/2021 06/08/2020  Decreased Interest 2 0 2  Down, Depressed, Hopeless PHQ - 2 Score Altered sleeping 2 - 2  Tired, decreased energy 2 - 0  Change in appetite 1 - 1  Feeling bad or failure about yourself  2 - 1  Trouble concentrating 2 - 1  Moving slowly or fidgety/restless 0 - 0  Suicidal thoughts 0 - 1  PHQ-9 Score 13 - 10  Difficult doing work/chores Somewhat difficult - -      Objective:    Physical Exam Vitals and nursing note reviewed.  Constitutional:      General: She is not in acute distress.    Appearance: Normal appearance. She is not ill-appearing, toxic-appearing or diaphoretic.  HENT:     Head: Normocephalic and atraumatic.     Right Ear: Tympanic membrane, ear canal and external ear normal.     Left Ear: Tympanic membrane, ear canal and external ear normal.     Mouth/Throat:     Mouth: Mucous membranes are moist.     Pharynx: Oropharynx is clear. No oropharyngeal exudate or posterior oropharyngeal erythema.  Eyes:     General: No scleral icterus.       Right eye: No discharge.        Left eye: No discharge.     Extraocular Movements: Extraocular movements intact.     Conjunctiva/sclera: Conjunctivae normal.     Pupils: Pupils are equal, round, and reactive to light.  Cardiovascular:     Rate and Rhythm: Normal rate and regular rhythm.  Pulmonary:     Effort: Pulmonary effort is normal.     Breath sounds: Normal breath sounds.  Abdominal:     General: Bowel sounds are normal.  Musculoskeletal:     Cervical back: No rigidity or tenderness.     Right lower leg: No edema.     Left lower leg: No edema.  Lymphadenopathy:     Cervical: No cervical adenopathy.  Skin:    General: Skin is warm and dry.  Neurological:     Mental Status: She is alert and oriented to person, place, and time.  Psychiatric:        Mood and Affect: Mood normal.        Behavior: Behavior normal.    BP 116/72 (BP Location: Left Arm, Patient Position: Sitting, Cuff Size: Large)   Pulse 89   Temp (!) 96.4 F (35.8 C) (Temporal)   Ht  (1.702 m)   Wt 181 lb 3.2 oz (82.2 kg)   SpO2 98%   BMI 28.38 kg/m  Wt Readings from Last 3 Encounters:  02/10/21 181 lb 3.2 oz (82.2 kg)  06/08/20 176 lb 9.6 oz (80.1 kg)  12/17/19 212 lb (96.2 kg)     Health Maintenance Due  Topic Date Due   Pneumococcal Vaccine 92-106 Years old (1 - PCV) Never done    Hepatitis C Screening  Never done   PAP-Cervical Cytology Screening  09/24/2020   PAP SMEAR-Modifier  09/24/2020  There are no preventive care reminders to display for this patient.  Lab Results  Component Value Date   TSH 2.14 09/24/2017   Lab Results  Component Value Date   WBC 6.0 09/24/2017   HGB 14.3 09/24/2017   HCT 42.2 09/24/2017   MCV 91.7 09/24/2017   PLT 226.0 09/24/2017   Lab Results  Component Value Date   NA 138 09/24/2017   K 3.9 09/24/2017   CO2 25 09/24/2017   GLUCOSE 90 09/24/2017   BUN 10 09/24/2017   CREATININE 0.84 09/24/2017   BILITOT 0.3 09/24/2017   ALKPHOS 56 09/24/2017   AST 4 09/24/2017   ALT 11 09/24/2017   PROT 6.7 09/24/2017   ALBUMIN 4.1 09/24/2017   CALCIUM 8.7 09/24/2017   GFR 88.66 09/24/2017   Lab Results  Component Value Date   CHOL 169 10/31/2011   Lab Results  Component Value Date   HDL 50.60 10/31/2011   Lab Results  Component Value Date   LDLCALC 94 10/31/2011   Lab Results  Component Value Date   TRIG 123.0 10/31/2011   Lab Results  Component Value Date   CHOLHDL 3 10/31/2011   Lab Results  Component Value Date   HGBA1C  02/14/2010    5.5 (NOTE)                                                                       According to the ADA Clinical Practice Recommendations for 2011, when HbA1c is used as a screening test:   >=6.5%   Diagnostic of Diabetes Mellitus           (if abnormal result  is confirmed)  5.7-6.4%   Increased risk of developing Diabetes Mellitus  References:Diagnosis and Classification of Diabetes Mellitus,Diabetes Care,2011,34(Suppl 1):S62-S69 and Standards of Medical Care in         Diabetes - 2011,Diabetes Care,2011,34  (Suppl 1):S11-S61.      Assessment & Plan:   Problem List Items Addressed This Visit       Other   Depression with anxiety - Primary   Relevant Medications   buPROPion (WELLBUTRIN XL) 150 MG 24 hr tablet   Contraception management   Relevant Medications    etonogestrel-ethinyl estradiol (NUVARING) 0.12-0.015 MG/24HR vaginal ring   Other Visit Diagnoses     Healthcare maintenance       Relevant Orders   Ambulatory referral to Obstetrics / Gynecology       Meds ordered this encounter  Medications   etonogestrel-ethinyl estradiol (NUVARING) 0.12-0.015 MG/24HR vaginal ring    Sig: INSERT 1 RING VAGINALLY AS DIRECTED. REMOVE AFTER 3 WEEKS & WAIT 7 DAYS BEFORE INSERTING A NEW RING    Dispense:  3 each    Refill:  3   buPROPion (WELLBUTRIN XL) 150 MG 24 hr tablet    Sig: Take 1 tablet (150 mg total) by mouth daily.    Dispense:  30 tablet    Refill:  1    Follow-up: Return in about 6 weeks (around 03/24/2021).  Agrees to give Wellbutrin a try.  Her mom is on it and has done well with it.  Information was given on mindfulness based stress reduction as well as on Wellbutrin itself.  Referred to GYN  for well woman care.  Follow-up with me in 6 to 8 weeks.  Declines STD checking today.  Mliss Sax, MD

## 2021-03-09 ENCOUNTER — Other Ambulatory Visit: Payer: Self-pay | Admitting: Family Medicine

## 2021-03-09 DIAGNOSIS — F418 Other specified anxiety disorders: Secondary | ICD-10-CM

## 2021-03-22 ENCOUNTER — Encounter: Payer: Self-pay | Admitting: Family Medicine

## 2021-03-22 ENCOUNTER — Ambulatory Visit: Payer: BC Managed Care – PPO | Admitting: Family Medicine

## 2021-03-22 ENCOUNTER — Other Ambulatory Visit: Payer: Self-pay

## 2021-03-22 VITALS — BP 116/68 | HR 77 | Temp 97.0°F | Ht 67.0 in | Wt 187.2 lb

## 2021-03-22 DIAGNOSIS — F418 Other specified anxiety disorders: Secondary | ICD-10-CM

## 2021-03-22 DIAGNOSIS — B001 Herpesviral vesicular dermatitis: Secondary | ICD-10-CM

## 2021-03-22 MED ORDER — VALACYCLOVIR HCL 1 G PO TABS: 500.0000 mg | ORAL_TABLET | Freq: Three times a day (TID) | ORAL | 4 refills | Status: AC

## 2021-03-22 MED ORDER — BUPROPION HCL ER (XL) 150 MG PO TB24: 300.0000 mg | ORAL_TABLET | Freq: Every day | ORAL | 2 refills | Status: DC

## 2021-03-22 NOTE — Progress Notes (Signed)
Established Patient Office Visit  Subjective:  Patient ID: Nancy Rojas, female    DOB: Sep 03, 1995  Age: 26 y.o. MRN: [id number]  CC:  Chief Complaint  Patient presents with   Follow-up    6 week follow up on anxiety and depression. Per patient there is not much difference since starting medication.     HPI Nancy Rojas presents for follow-up of anxiety with depression.  She continues with Wellbutrin XL 150 mg daily.  She is tolerating the drug well.  There has maybe been a small improvement but feels as though things could definitely be better.  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  She is recently obtained a gym membership.  She has sometimes felt as though things might be better if she were not here but is never seriously considered hurting herself.  She found her mother after an attempted suicide and that even she has talked with her.  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  Outbreaks come 2-3 times yearly  Past Medical History:  Diagnosis Date   Depression    History of self mutilation    h/o admissions to behavioral health   Menorrhagia     No past surgical history on file.  Family History  Problem Relation Age of Onset   Heart disease Father 63       MI   Cancer Maternal Grandmother 19       breast ca    Social History   Socioeconomic History   Marital status: Single    Spouse name: Not on file   Number of children: Not on file   Years of education: Not on file   Highest education level: Not on file  Occupational History   Not on file  Tobacco Use   Smoking status: Former    Packs/day: 0.25    Types: Cigarettes   Smokeless tobacco: Never  Substance and Sexual Activity   Alcohol use: No    Alcohol/week: 0.0 standard drinks   Drug use: Yes    Types: Marijuana    Comment: occ   Sexual activity: Not on file  Other Topics Concern   Not on file  Social History  Narrative   Dropped out of school.   Going to get her GED, wants to go to community college.   Not sexually active.   Good relationship with her mother.   Social Determinants of Health   Financial Resource Strain: Not on file  Food Insecurity: Not on file  Transportation Needs: Not on file  Physical Activity: Not on file  Stress: Not on file  Social Connections: Not on file  Intimate Partner Violence: Not on file    Outpatient Medications Prior to Visit  Medication Sig Dispense Refill   etonogestrel-ethinyl estradiol (NUVARING) 0.12-0.015 MG/24HR vaginal ring INSERT 1 RING VAGINALLY AS DIRECTED. REMOVE AFTER 3 WEEKS & WAIT 7 DAYS BEFORE INSERTING A NEW RING 3 each 3   valACYclovir (VALTREX) 1000 MG tablet Take two pills, then take another two 12 hours later.  Use as needed for cold sore 28 tablet 0   buPROPion (WELLBUTRIN XL) 150 MG 24 hr tablet Take 1 tablet (150 mg total) by mouth daily. 30 tablet 1   No facility-administered medications prior to visit.    No Known Allergies  ROS Review of Systems  Constitutional:  Negative for diaphoresis, fatigue, fever and unexpected weight change.  HENT:  Negative.    Eyes:  Negative for photophobia and visual disturbance.  Respiratory: Negative.    Cardiovascular: Negative.   Gastrointestinal: Negative.   Psychiatric/Behavioral:  Positive for dysphoric mood. Negative for self-injury and suicidal ideas. The patient is nervous/anxious.      Objective:    Physical Exam Vitals and nursing note reviewed.  Constitutional:      General: She is not in acute distress.    Appearance: Normal appearance. She is not ill-appearing, toxic-appearing or diaphoretic.  HENT:     Head: Normocephalic and atraumatic.     Right Ear: External ear normal.     Left Ear: External ear normal.     Mouth/Throat:     Mouth: Mucous membranes are moist.     Pharynx: Oropharynx is clear. No oropharyngeal exudate or posterior oropharyngeal erythema.  Eyes:      General: No scleral icterus.       Right eye: No discharge.        Left eye: No discharge.     Extraocular Movements: Extraocular movements intact.     Conjunctiva/sclera: Conjunctivae normal.     Pupils: Pupils are equal, round, and reactive to light.  Cardiovascular:     Rate and Rhythm: Normal rate and regular rhythm.  Pulmonary:     Effort: Pulmonary effort is normal.     Breath sounds: Normal breath sounds.  Musculoskeletal:     Cervical back: No rigidity or tenderness.  Lymphadenopathy:     Cervical: No cervical adenopathy.  Skin:    General: Skin is warm and dry.  Neurological:     Mental Status: She is alert and oriented to person, place, and time.  Psychiatric:        Mood and Affect: Mood normal.        Behavior: Behavior normal.    BP 116/68 (BP Location: Right Arm, Patient Position: Sitting, Cuff Size: Normal)    Pulse 77    Temp (!) 97 F (36.1 C) (Temporal)    Ht 5\' 7"  (1.702 m)    Wt 187 lb 3.2 oz (84.9 kg)    BMI 29.32 kg/m  Wt Readings from Last 3 Encounters:  03/22/21 187 lb 3.2 oz (84.9 kg)  02/10/21 181 lb 3.2 oz (82.2 kg)  06/08/20 176 lb 9.6 oz (80.1 kg)     Health Maintenance Due  Topic Date Due   Pneumococcal Vaccine 69-44 Years old (1 - PCV) Never done   Hepatitis C Screening  Never done   PAP-Cervical Cytology Screening  09/24/2020   PAP SMEAR-Modifier  09/24/2020    There are no preventive care reminders to display for this patient.  Lab Results  Component Value Date   TSH 2.14 09/24/2017   Lab Results  Component Value Date   WBC 6.0 09/24/2017   HGB 14.3 09/24/2017   HCT 42.2 09/24/2017   MCV 91.7 09/24/2017   PLT 226.0 09/24/2017   Lab Results  Component Value Date   NA 138 09/24/2017   K 3.9 09/24/2017   CO2 25 09/24/2017   GLUCOSE 90 09/24/2017   BUN 10 09/24/2017   CREATININE 0.84 09/24/2017   BILITOT 0.3 09/24/2017   ALKPHOS 56 09/24/2017   AST 4 09/24/2017   ALT 11 09/24/2017   PROT 6.7 09/24/2017   ALBUMIN 4.1  09/24/2017   CALCIUM 8.7 09/24/2017   GFR 88.66 09/24/2017   Lab Results  Component Value Date   CHOL 169 10/31/2011   Lab Results  Component Value Date  HDL 50.60 10/31/2011   Lab Results  Component Value Date   LDLCALC 94 10/31/2011   Lab Results  Component Value Date   TRIG 123.0 10/31/2011   Lab Results  Component Value Date   CHOLHDL 3 10/31/2011   Lab Results  Component Value Date   HGBA1C  02/14/2010    5.5 (NOTE)                                                                       According to the ADA Clinical Practice Recommendations for 2011, when HbA1c is used as a screening test:   >=6.5%   Diagnostic of Diabetes Mellitus           (if abnormal result  is confirmed)  5.7-6.4%   Increased risk of developing Diabetes Mellitus  References:Diagnosis and Classification of Diabetes Mellitus,Diabetes [id number] 1):S62-S69 and Standards of Medical Care in         Diabetes - 2011,Diabetes [id number]  (Suppl 1):S11-S61.      Assessment & Plan:   Problem List Items Addressed This Visit       Other   Depression with anxiety - Primary   Relevant Medications   buPROPion (WELLBUTRIN XL) 150 MG 24 hr tablet   Other Visit Diagnoses     Cold sore       Relevant Medications   valACYclovir (VALTREX) 1000 MG tablet       Meds ordered this encounter  Medications   buPROPion (WELLBUTRIN XL) 150 MG 24 hr tablet    Sig: Take 2 tablets (300 mg total) by mouth daily.    Dispense:  60 tablet    Refill:  2   valACYclovir (VALTREX) 1000 MG tablet    Sig: Take 0.5 tablets (500 mg total) by mouth 3 (three) times daily for 3 days. As needed for cold sore outbreaks.    Dispense:  9 tablet    Refill:  4    Follow-up: Return in about 2 months (around 05/20/2021), or if symptoms worsen or fail to improve.  Have increased Wellbutrin XL to 300 mg daily.  Encouraged exercise and mindfulness based stress reduction with meditation.  We discussed talking therapy as an  option.  Follow-up in 2 months or sooner  Libby Maw, MD

## 2021-04-14 ENCOUNTER — Other Ambulatory Visit: Payer: Self-pay | Admitting: Family Medicine

## 2021-04-14 DIAGNOSIS — F418 Other specified anxiety disorders: Secondary | ICD-10-CM

## 2021-04-18 ENCOUNTER — Other Ambulatory Visit: Payer: Self-pay | Admitting: Family Medicine

## 2021-04-18 DIAGNOSIS — F418 Other specified anxiety disorders: Secondary | ICD-10-CM

## 2021-05-22 ENCOUNTER — Ambulatory Visit: Payer: BC Managed Care – PPO | Admitting: Family Medicine

## 2021-08-16 ENCOUNTER — Ambulatory Visit (INDEPENDENT_AMBULATORY_CARE_PROVIDER_SITE_OTHER): Payer: BC Managed Care – PPO | Admitting: Nurse Practitioner

## 2021-08-16 ENCOUNTER — Encounter: Payer: Self-pay | Admitting: Nurse Practitioner

## 2021-08-16 DIAGNOSIS — Z3009 Encounter for other general counseling and advice on contraception: Secondary | ICD-10-CM

## 2021-08-16 MED ORDER — ETONOGESTREL-ETHINYL ESTRADIOL 0.12-0.015 MG/24HR VA RING: VAGINAL_RING | VAGINAL | 3 refills | Status: AC

## 2021-08-16 NOTE — Progress Notes (Signed)
She was here today to follow-up on ADD and that the Wellbutrin wasn't working and wants to go back on adderall. Discussed that she will need to follow-up with her PCP for this.

## 2021-10-03 DIAGNOSIS — F900 Attention-deficit hyperactivity disorder, predominantly inattentive type: Secondary | ICD-10-CM | POA: Diagnosis not present

## 2021-10-03 DIAGNOSIS — F411 Generalized anxiety disorder: Secondary | ICD-10-CM | POA: Diagnosis not present

## 2021-10-03 DIAGNOSIS — F332 Major depressive disorder, recurrent severe without psychotic features: Secondary | ICD-10-CM | POA: Diagnosis not present

## 2021-10-03 DIAGNOSIS — F3489 Other specified persistent mood disorders: Secondary | ICD-10-CM | POA: Diagnosis not present

## 2021-10-24 DIAGNOSIS — R41840 Attention and concentration deficit: Secondary | ICD-10-CM | POA: Diagnosis not present

## 2021-10-24 DIAGNOSIS — F3489 Other specified persistent mood disorders: Secondary | ICD-10-CM | POA: Diagnosis not present

## 2021-10-24 DIAGNOSIS — F900 Attention-deficit hyperactivity disorder, predominantly inattentive type: Secondary | ICD-10-CM | POA: Diagnosis not present

## 2021-10-24 DIAGNOSIS — F411 Generalized anxiety disorder: Secondary | ICD-10-CM | POA: Diagnosis not present

## 2021-10-24 DIAGNOSIS — F331 Major depressive disorder, recurrent, moderate: Secondary | ICD-10-CM | POA: Diagnosis not present

## 2022-01-05 DIAGNOSIS — Z113 Encounter for screening for infections with a predominantly sexual mode of transmission: Secondary | ICD-10-CM | POA: Diagnosis not present

## 2022-01-05 DIAGNOSIS — E669 Obesity, unspecified: Secondary | ICD-10-CM | POA: Diagnosis not present

## 2022-01-05 DIAGNOSIS — Z6837 Body mass index (BMI) 37.0-37.9, adult: Secondary | ICD-10-CM | POA: Diagnosis not present

## 2022-01-05 DIAGNOSIS — F419 Anxiety disorder, unspecified: Secondary | ICD-10-CM | POA: Diagnosis not present

## 2022-01-05 DIAGNOSIS — Z1151 Encounter for screening for human papillomavirus (HPV): Secondary | ICD-10-CM | POA: Diagnosis not present

## 2022-01-05 DIAGNOSIS — Z0000 Encounter for general adult medical examination without abnormal findings: Secondary | ICD-10-CM | POA: Diagnosis not present

## 2022-01-05 DIAGNOSIS — Z01419 Encounter for gynecological examination (general) (routine) without abnormal findings: Secondary | ICD-10-CM | POA: Diagnosis not present

## 2022-01-11 LAB — HM PAP SMEAR
HPV 16/18/45 genotyping: NEGATIVE
HPV, high-risk: POSITIVE

## 2022-02-15 DIAGNOSIS — E669 Obesity, unspecified: Secondary | ICD-10-CM | POA: Diagnosis not present

## 2022-02-15 DIAGNOSIS — Z6836 Body mass index (BMI) 36.0-36.9, adult: Secondary | ICD-10-CM | POA: Diagnosis not present

## 2022-02-15 DIAGNOSIS — G43009 Migraine without aura, not intractable, without status migrainosus: Secondary | ICD-10-CM | POA: Diagnosis not present

## 2022-02-15 DIAGNOSIS — Z1331 Encounter for screening for depression: Secondary | ICD-10-CM | POA: Diagnosis not present

## 2022-03-30 DIAGNOSIS — Z1331 Encounter for screening for depression: Secondary | ICD-10-CM | POA: Diagnosis not present

## 2022-03-30 DIAGNOSIS — G43009 Migraine without aura, not intractable, without status migrainosus: Secondary | ICD-10-CM | POA: Diagnosis not present

## 2022-03-30 DIAGNOSIS — F419 Anxiety disorder, unspecified: Secondary | ICD-10-CM | POA: Diagnosis not present

## 2022-03-30 DIAGNOSIS — E669 Obesity, unspecified: Secondary | ICD-10-CM | POA: Diagnosis not present

## 2022-08-17 ENCOUNTER — Other Ambulatory Visit: Payer: Self-pay | Admitting: Nurse Practitioner

## 2022-08-17 DIAGNOSIS — Z3009 Encounter for other general counseling and advice on contraception: Secondary | ICD-10-CM

## 2022-08-29 ENCOUNTER — Other Ambulatory Visit: Payer: Self-pay | Admitting: Family Medicine

## 2022-08-29 DIAGNOSIS — B001 Herpesviral vesicular dermatitis: Secondary | ICD-10-CM

## 2022-09-27 DIAGNOSIS — Z13228 Encounter for screening for other metabolic disorders: Secondary | ICD-10-CM | POA: Diagnosis not present

## 2022-09-27 DIAGNOSIS — Z131 Encounter for screening for diabetes mellitus: Secondary | ICD-10-CM | POA: Diagnosis not present

## 2022-09-27 DIAGNOSIS — E669 Obesity, unspecified: Secondary | ICD-10-CM | POA: Diagnosis not present

## 2022-09-27 DIAGNOSIS — Z6837 Body mass index (BMI) 37.0-37.9, adult: Secondary | ICD-10-CM | POA: Diagnosis not present

## 2022-09-27 DIAGNOSIS — F419 Anxiety disorder, unspecified: Secondary | ICD-10-CM | POA: Diagnosis not present

## 2022-09-27 DIAGNOSIS — Z13220 Encounter for screening for lipoid disorders: Secondary | ICD-10-CM | POA: Diagnosis not present

## 2022-12-27 DIAGNOSIS — L509 Urticaria, unspecified: Secondary | ICD-10-CM | POA: Diagnosis not present

## 2022-12-27 DIAGNOSIS — Z1331 Encounter for screening for depression: Secondary | ICD-10-CM | POA: Diagnosis not present

## 2022-12-27 DIAGNOSIS — E669 Obesity, unspecified: Secondary | ICD-10-CM | POA: Diagnosis not present

## 2022-12-27 DIAGNOSIS — E781 Pure hyperglyceridemia: Secondary | ICD-10-CM | POA: Diagnosis not present

## 2023-04-28 DIAGNOSIS — H9203 Otalgia, bilateral: Secondary | ICD-10-CM | POA: Diagnosis not present

## 2023-04-28 DIAGNOSIS — Z20822 Contact with and (suspected) exposure to covid-19: Secondary | ICD-10-CM | POA: Diagnosis not present

## 2023-04-28 DIAGNOSIS — R509 Fever, unspecified: Secondary | ICD-10-CM | POA: Diagnosis not present

## 2023-05-02 NOTE — Progress Notes (Unsigned)
 New patient visit   Patient: Nancy Rojas   DOB: 30-Oct-1995   28 y.o. Female  MRN: [id number] Visit Date: 05/03/2023  Today's healthcare provider: Alfredia Ferguson, PA-C   No chief complaint on file.  Subjective    Nancy Rojas is a 28 y.o. female who presents today as a new patient to establish care.   ***  Past Medical History:  Diagnosis Date   Depression    History of self mutilation    h/o admissions to behavioral health   Menorrhagia    No past surgical history on file. Family Status  Relation Name Status   Mother  Alive   Father  Alive   MGM  (Not Specified)  No partnership data on file   Family History  Problem Relation Age of Onset   Heart disease Father 70       MI   Cancer Maternal Grandmother 55       breast ca   Social History   Socioeconomic History   Marital status: Single    Spouse name: Not on file   Number of children: Not on file   Years of education: Not on file   Highest education level: Not on file  Occupational History   Not on file  Tobacco Use   Smoking status: Former    Current packs/day: 0.25    Types: Cigarettes   Smokeless tobacco: Never  Substance and Sexual Activity   Alcohol use: No    Alcohol/week: 0.0 standard drinks of alcohol   Drug use: Yes    Types: Marijuana    Comment: occ   Sexual activity: Not on file  Other Topics Concern   Not on file  Social History Narrative   Dropped out of school.   Going to get her GED, wants to go to community college.   Not sexually active.   Good relationship with her mother.   Social Drivers of Corporate investment banker Strain: Not on file  Food Insecurity: Not on file  Transportation Needs: Not on file  Physical Activity: Not on file  Stress: Not on file  Social Connections: Not on file   Outpatient Medications Prior to Visit  Medication Sig   buPROPion (WELLBUTRIN XL) 150 MG 24 hr tablet TAKE 2 TABLETS BY MOUTH DAILY. (Patient not taking: Reported on  08/16/2021)   etonogestrel-ethinyl estradiol (NUVARING) 0.12-0.015 MG/24HR vaginal ring INSERT 1 RING VAGINALLY AS DIRECTED. REMOVE AFTER 3 WEEKS & WAIT 7 DAYS BEFORE INSERTING A NEW RING   valACYclovir (VALTREX) 1000 MG tablet TAKE HALF TABLETS BY MOUTH 3 (THREE) TIMES DAILY FOR 3 DAYS. AS NEEDED FOR COLD SORE OUTBREAKS.   No facility-administered medications prior to visit.   No Known Allergies  Immunization History  Administered Date(s) Administered   DTaP 01/31/1994, 03/26/1994, 06/14/1994, 07/03/1996, 09/23/1998   Dtap, Unspecified 01/31/1994, 03/26/1994, 06/14/1994, 07/03/1996, 09/23/1998   HIB (PRP-OMP) 01/30/1994, 03/26/1994, 06/14/1994, 07/03/1996   HIB, Unspecified 01/30/1994, 03/26/1994, 06/14/1994, 07/03/1996   Hepatitis A 10/25/2005, 07/11/2006   Hepatitis A, Ped/Adol-2 Dose 10/25/2005, 07/11/2006   Hepatitis B 07[birth year]Sep-1993, 01/03/1994, 06/14/1994   IPV 01/30/1994, 03/26/1994, 06/14/1994, 09/23/1998   MMR 07/03/1996, 09/23/1998   Meningococcal Conjugate 10/25/2005   Polio, Unspecified 01/30/1994, 03/26/1994, 06/14/1994, 09/23/1998   Tdap 10/25/2005, 03/22/2017    Health Maintenance  Topic Date Due   Hepatitis C Screening  Never done   Cervical Cancer Screening (Pap smear)  09/24/2020   DTaP/Tdap/Td (8 - Td or Tdap) 03/23/2027  HIV Screening  Completed   HPV VACCINES  Aged Out   INFLUENZA VACCINE  Discontinued   COVID-19 Vaccine  Discontinued    Patient Care Team: Mliss Sax, MD as PCP - General (Family Medicine)  Review of Systems  {Insert previous labs (optional):23779} {See past labs  Heme  Chem  Endocrine  Serology  Results Review (optional):1}   Objective    There were no vitals taken for this visit. {Insert last BP/Wt (optional):23777}{See vitals history (optional):1}   Physical Exam ***  Depression Screen    03/22/2021   10:31 AM 03/22/2021   10:04 AM 02/10/2021    2:16 PM 02/10/2021    1:45 PM  PHQ 2/9 Scores  PHQ - 2 Score 3  3 4 1   PHQ- 9 Score 11  13    No results found for any visits on 05/03/23.  Assessment & Plan     There are no diagnoses linked to this encounter.   No follow-ups on file.      Alfredia Ferguson, PA-C  [facility] at [facility] [phone] (phone) [phone] (fax)  [facility]

## 2023-05-03 ENCOUNTER — Ambulatory Visit (INDEPENDENT_AMBULATORY_CARE_PROVIDER_SITE_OTHER): Payer: BC Managed Care – PPO | Admitting: Physician Assistant

## 2023-05-03 VITALS — BP 124/80 | HR 85 | Ht 68.0 in | Wt 196.2 lb

## 2023-05-03 DIAGNOSIS — E663 Overweight: Secondary | ICD-10-CM

## 2023-05-03 DIAGNOSIS — Z7689 Persons encountering health services in other specified circumstances: Secondary | ICD-10-CM

## 2023-05-03 MED ORDER — ZEPBOUND 15 MG/0.5ML ~~LOC~~ SOAJ: 15.0000 mg | SUBCUTANEOUS | 2 refills | Status: AC

## 2023-05-03 NOTE — Assessment & Plan Note (Signed)
 She has been on Zepbound for weight management for six months. The plan is to continue Zepbound until she reaches her goal weight or experiences a plateau, then gradually taper. The approach will be adjusted based on her progress. -will check cbc, cmp, tsh

## 2023-05-04 LAB — COMPREHENSIVE METABOLIC PANEL
ALT: 103 IU/L — ABNORMAL HIGH (ref 0–32)
AST: 34 IU/L (ref 0–40)
Albumin: 4.5 g/dL (ref 4.0–5.0)
Alkaline Phosphatase: 101 IU/L (ref 44–121)
BUN/Creatinine Ratio: 7 — ABNORMAL LOW (ref 9–23)
BUN: 5 mg/dL — ABNORMAL LOW (ref 6–20)
Bilirubin Total: 0.9 mg/dL (ref 0.0–1.2)
CO2: 21 mmol/L (ref 20–29)
Calcium: 9.5 mg/dL (ref 8.7–10.2)
Chloride: 103 mmol/L (ref 96–106)
Creatinine, Ser: 0.73 mg/dL (ref 0.57–1.00)
Globulin, Total: 2.7 g/dL (ref 1.5–4.5)
Glucose: 108 mg/dL — ABNORMAL HIGH (ref 70–99)
Potassium: 3.9 mmol/L (ref 3.5–5.2)
Sodium: 141 mmol/L (ref 134–144)
Total Protein: 7.2 g/dL (ref 6.0–8.5)
eGFR: 114 mL/min/{1.73_m2} (ref >59)

## 2023-05-04 LAB — CBC WITH DIFFERENTIAL/PLATELET
Basophils Absolute: 0.1 10*3/uL (ref 0.0–0.2)
Basos: 1 %
EOS (ABSOLUTE): 0.2 10*3/uL (ref 0.0–0.4)
Eos: 3 %
Hematocrit: 48.6 % — ABNORMAL HIGH (ref 34.0–46.6)
Hemoglobin: 16.4 g/dL — ABNORMAL HIGH (ref 11.1–15.9)
Immature Grans (Abs): 0 10*3/uL (ref 0.0–0.1)
Immature Granulocytes: 0 %
Lymphocytes Absolute: 3.7 10*3/uL — ABNORMAL HIGH (ref 0.7–3.1)
Lymphs: 51 %
MCH: 29.5 pg (ref 26.6–33.0)
MCHC: 33.7 g/dL (ref 31.5–35.7)
MCV: 87 fL (ref 79–97)
Monocytes Absolute: 0.5 10*3/uL (ref 0.1–0.9)
Monocytes: 7 %
Neutrophils Absolute: 2.9 10*3/uL (ref 1.4–7.0)
Neutrophils: 38 %
Platelets: 301 10*3/uL (ref 150–450)
RBC: 5.56 x10E6/uL — ABNORMAL HIGH (ref 3.77–5.28)
RDW: 13 % (ref 11.7–15.4)
WBC: 7.5 10*3/uL (ref 3.4–10.8)

## 2023-05-04 LAB — TSH: TSH: 1.81 u[IU]/mL (ref 0.450–4.500)

## 2023-05-07 ENCOUNTER — Encounter: Payer: Self-pay | Admitting: Physician Assistant

## 2023-05-07 ENCOUNTER — Other Ambulatory Visit: Payer: Self-pay | Admitting: Physician Assistant

## 2023-05-07 DIAGNOSIS — D582 Other hemoglobinopathies: Secondary | ICD-10-CM

## 2023-05-07 DIAGNOSIS — R748 Abnormal levels of other serum enzymes: Secondary | ICD-10-CM

## 2023-05-21 ENCOUNTER — Other Ambulatory Visit (INDEPENDENT_AMBULATORY_CARE_PROVIDER_SITE_OTHER)

## 2023-05-21 DIAGNOSIS — D582 Other hemoglobinopathies: Secondary | ICD-10-CM

## 2023-05-21 DIAGNOSIS — R748 Abnormal levels of other serum enzymes: Secondary | ICD-10-CM | POA: Diagnosis not present

## 2023-05-22 LAB — CBC WITH DIFFERENTIAL/PLATELET
Basophils Absolute: 0.1 10*3/uL (ref 0.0–0.1)
Basophils Relative: 1.1 % (ref 0.0–3.0)
Eosinophils Absolute: 0.2 10*3/uL (ref 0.0–0.7)
Eosinophils Relative: 2.1 % (ref 0.0–5.0)
HCT: 46.5 % — ABNORMAL HIGH (ref 36.0–46.0)
Hemoglobin: 15.5 g/dL — ABNORMAL HIGH (ref 12.0–15.0)
Lymphocytes Relative: 40.1 % (ref 12.0–46.0)
Lymphs Abs: 4 10*3/uL (ref 0.7–4.0)
MCHC: 33.3 g/dL (ref 30.0–36.0)
MCV: 89.8 fl (ref 78.0–100.0)
Monocytes Absolute: 0.6 10*3/uL (ref 0.1–1.0)
Monocytes Relative: 5.5 % (ref 3.0–12.0)
Neutro Abs: 5.1 10*3/uL (ref 1.4–7.7)
Neutrophils Relative %: 51.2 % (ref 43.0–77.0)
Platelets: 287 10*3/uL (ref 150.0–400.0)
RBC: 5.18 Mil/uL — ABNORMAL HIGH (ref 3.87–5.11)
RDW: 13.7 % (ref 11.5–15.5)
WBC: 10.1 10*3/uL (ref 4.0–10.5)

## 2023-05-22 LAB — COMPREHENSIVE METABOLIC PANEL
ALT: 31 U/L (ref 0–35)
AST: 10 U/L (ref 0–37)
Albumin: 4.7 g/dL (ref 3.5–5.2)
Alkaline Phosphatase: 93 U/L (ref 39–117)
BUN: 8 mg/dL (ref 6–23)
CO2: 23 meq/L (ref 19–32)
Calcium: 10 mg/dL (ref 8.4–10.5)
Chloride: 99 meq/L (ref 96–112)
Creatinine, Ser: 0.74 mg/dL (ref 0.40–1.20)
GFR: 109.28 mL/min (ref >60.00)
Glucose, Bld: 73 mg/dL (ref 70–99)
Potassium: 4.7 meq/L (ref 3.5–5.1)
Sodium: 136 meq/L (ref 135–145)
Total Bilirubin: 0.9 mg/dL (ref 0.2–1.2)
Total Protein: 7.7 g/dL (ref 6.0–8.3)

## 2023-07-09 ENCOUNTER — Telehealth: Payer: Self-pay

## 2023-07-09 NOTE — Telephone Encounter (Signed)
 Pharmacy Patient Advocate Encounter   Received notification from CoverMyMeds that prior authorization for Zepbound  15MG /0.5ML pen-injectors is required/requested.   Insurance verification completed.   The patient is insured through [facility] .   Per test claim: PA required; PA submitted to above mentioned insurance via CoverMyMeds Key/confirmation #/EOC [id number] Status is pending

## 2023-07-10 ENCOUNTER — Other Ambulatory Visit (HOSPITAL_COMMUNITY): Payer: Self-pay

## 2023-07-10 NOTE — Telephone Encounter (Signed)
 Pharmacy Patient Advocate Encounter  Received notification from [facility] that Prior Authorization for Zepbound  15MG /0.5ML pen-injectors has been APPROVED from 07/09/2023 to 07/08/2024. Ran test claim, Copay is $57.95. This test claim was processed through [facility]- copay amounts may vary at other pharmacies due to pharmacy/plan contracts, or as the patient moves through the different stages of their insurance plan.   PA #/Case ID/Reference #: [id number]

## 2023-07-27 ENCOUNTER — Encounter: Payer: Self-pay | Admitting: Physician Assistant

## 2023-07-30 ENCOUNTER — Other Ambulatory Visit: Payer: Self-pay | Admitting: *Deleted

## 2023-07-30 DIAGNOSIS — B001 Herpesviral vesicular dermatitis: Secondary | ICD-10-CM

## 2023-07-30 MED ORDER — VALACYCLOVIR HCL 1 G PO TABS: ORAL_TABLET | ORAL | 2 refills | Status: AC

## 2023-08-05 ENCOUNTER — Ambulatory Visit: Admitting: Physician Assistant

## 2023-08-06 ENCOUNTER — Ambulatory Visit: Admitting: Physician Assistant

## 2023-08-15 ENCOUNTER — Ambulatory Visit: Admitting: Physician Assistant
